# Patient Record
Sex: Female | Born: 1980
Health system: Southern US, Community
[De-identification: ages and names within clinical notes are randomized; demographics above are authoritative.]

## PROBLEM LIST (undated history)

## (undated) DIAGNOSIS — T782XXA Anaphylactic shock, unspecified, initial encounter: Secondary | ICD-10-CM

## (undated) DIAGNOSIS — K900 Celiac disease: Secondary | ICD-10-CM

## (undated) DIAGNOSIS — D75839 Thrombocytosis, unspecified: Secondary | ICD-10-CM

## (undated) DIAGNOSIS — D509 Iron deficiency anemia, unspecified: Secondary | ICD-10-CM

## (undated) DIAGNOSIS — D473 Essential (hemorrhagic) thrombocythemia: Secondary | ICD-10-CM

## (undated) DIAGNOSIS — O24419 Gestational diabetes mellitus in pregnancy, unspecified control: Secondary | ICD-10-CM

## (undated) DIAGNOSIS — O1490 Unspecified pre-eclampsia, unspecified trimester: Secondary | ICD-10-CM

## (undated) DIAGNOSIS — K253 Acute gastric ulcer without hemorrhage or perforation: Secondary | ICD-10-CM

## (undated) DIAGNOSIS — O9902 Anemia complicating childbirth: Secondary | ICD-10-CM

## (undated) HISTORY — DX: Anaphylactic shock, unspecified, initial encounter: T78.2XXA

## (undated) SURGERY — Surgical Case
Anesthesia: *Unknown

---

## 1998-04-13 HISTORY — PX: APPENDECTOMY: SHX54

## 1999-07-30 ENCOUNTER — Encounter (INDEPENDENT_AMBULATORY_CARE_PROVIDER_SITE_OTHER): Payer: Self-pay | Admitting: Specialist

## 1999-07-30 ENCOUNTER — Encounter (HOSPITAL_BASED_OUTPATIENT_CLINIC_OR_DEPARTMENT_OTHER): Payer: Self-pay | Admitting: General Surgery

## 1999-07-31 ENCOUNTER — Encounter (HOSPITAL_BASED_OUTPATIENT_CLINIC_OR_DEPARTMENT_OTHER): Payer: Self-pay | Admitting: General Surgery

## 1999-07-31 ENCOUNTER — Inpatient Hospital Stay (HOSPITAL_COMMUNITY): Admission: EM | Admit: 1999-07-31 | Discharge: 1999-08-04 | Payer: Self-pay | Admitting: Emergency Medicine

## 1999-08-02 ENCOUNTER — Encounter (HOSPITAL_BASED_OUTPATIENT_CLINIC_OR_DEPARTMENT_OTHER): Payer: Self-pay | Admitting: General Surgery

## 2003-09-03 ENCOUNTER — Other Ambulatory Visit: Admission: RE | Admit: 2003-09-03 | Discharge: 2003-09-03 | Payer: Self-pay | Admitting: Obstetrics and Gynecology

## 2004-06-25 ENCOUNTER — Inpatient Hospital Stay (HOSPITAL_COMMUNITY): Admission: AD | Admit: 2004-06-25 | Discharge: 2004-06-25 | Payer: Self-pay | Admitting: Obstetrics and Gynecology

## 2004-06-25 ENCOUNTER — Inpatient Hospital Stay (HOSPITAL_COMMUNITY): Admission: AD | Admit: 2004-06-25 | Discharge: 2004-06-28 | Payer: Self-pay | Admitting: Obstetrics and Gynecology

## 2004-06-29 ENCOUNTER — Encounter: Admission: RE | Admit: 2004-06-29 | Discharge: 2004-07-29 | Payer: Self-pay | Admitting: Obstetrics and Gynecology

## 2004-07-30 ENCOUNTER — Encounter: Admission: RE | Admit: 2004-07-30 | Discharge: 2004-08-29 | Payer: Self-pay | Admitting: Obstetrics and Gynecology

## 2004-09-29 ENCOUNTER — Encounter: Admission: RE | Admit: 2004-09-29 | Discharge: 2004-10-29 | Payer: Self-pay | Admitting: Obstetrics and Gynecology

## 2004-11-29 ENCOUNTER — Encounter: Admission: RE | Admit: 2004-11-29 | Discharge: 2004-12-29 | Payer: Self-pay | Admitting: Obstetrics and Gynecology

## 2004-12-30 ENCOUNTER — Encounter: Admission: RE | Admit: 2004-12-30 | Discharge: 2005-01-28 | Payer: Self-pay | Admitting: Obstetrics and Gynecology

## 2005-01-29 ENCOUNTER — Encounter: Admission: RE | Admit: 2005-01-29 | Discharge: 2005-02-28 | Payer: Self-pay | Admitting: Obstetrics and Gynecology

## 2005-03-01 ENCOUNTER — Encounter: Admission: RE | Admit: 2005-03-01 | Discharge: 2005-03-30 | Payer: Self-pay | Admitting: Obstetrics and Gynecology

## 2005-03-31 ENCOUNTER — Encounter: Admission: RE | Admit: 2005-03-31 | Discharge: 2005-04-20 | Payer: Self-pay | Admitting: Obstetrics and Gynecology

## 2005-10-21 ENCOUNTER — Ambulatory Visit: Payer: Self-pay | Admitting: Internal Medicine

## 2007-03-29 ENCOUNTER — Ambulatory Visit: Payer: Self-pay | Admitting: Internal Medicine

## 2007-04-14 DIAGNOSIS — O1490 Unspecified pre-eclampsia, unspecified trimester: Secondary | ICD-10-CM

## 2007-04-14 DIAGNOSIS — O24419 Gestational diabetes mellitus in pregnancy, unspecified control: Secondary | ICD-10-CM

## 2007-04-14 DIAGNOSIS — O9902 Anemia complicating childbirth: Secondary | ICD-10-CM

## 2007-04-14 HISTORY — DX: Anemia complicating childbirth: O99.02

## 2007-04-14 HISTORY — PX: TUBAL LIGATION: SHX77

## 2007-04-14 HISTORY — DX: Unspecified pre-eclampsia, unspecified trimester: O14.90

## 2007-04-14 HISTORY — DX: Gestational diabetes mellitus in pregnancy, unspecified control: O24.419

## 2007-05-27 ENCOUNTER — Ambulatory Visit (HOSPITAL_COMMUNITY): Admission: RE | Admit: 2007-05-27 | Discharge: 2007-05-27 | Payer: Self-pay | Admitting: Obstetrics and Gynecology

## 2007-06-24 ENCOUNTER — Ambulatory Visit (HOSPITAL_COMMUNITY): Admission: RE | Admit: 2007-06-24 | Discharge: 2007-06-24 | Payer: Self-pay | Admitting: Obstetrics and Gynecology

## 2007-07-08 ENCOUNTER — Ambulatory Visit (HOSPITAL_COMMUNITY): Admission: RE | Admit: 2007-07-08 | Discharge: 2007-07-08 | Payer: Self-pay | Admitting: Obstetrics and Gynecology

## 2007-07-21 ENCOUNTER — Ambulatory Visit (HOSPITAL_COMMUNITY): Admission: RE | Admit: 2007-07-21 | Discharge: 2007-07-21 | Payer: Self-pay | Admitting: Obstetrics and Gynecology

## 2007-08-05 ENCOUNTER — Ambulatory Visit (HOSPITAL_COMMUNITY): Admission: RE | Admit: 2007-08-05 | Discharge: 2007-08-05 | Payer: Self-pay | Admitting: Obstetrics and Gynecology

## 2007-08-22 ENCOUNTER — Ambulatory Visit (HOSPITAL_COMMUNITY): Admission: RE | Admit: 2007-08-22 | Discharge: 2007-08-22 | Payer: Self-pay | Admitting: Obstetrics and Gynecology

## 2007-09-06 ENCOUNTER — Ambulatory Visit (HOSPITAL_COMMUNITY): Admission: RE | Admit: 2007-09-06 | Discharge: 2007-09-06 | Payer: Self-pay | Admitting: Obstetrics and Gynecology

## 2007-09-20 ENCOUNTER — Ambulatory Visit (HOSPITAL_COMMUNITY): Admission: RE | Admit: 2007-09-20 | Discharge: 2007-09-20 | Payer: Self-pay | Admitting: Obstetrics and Gynecology

## 2007-09-27 ENCOUNTER — Encounter: Admission: RE | Admit: 2007-09-27 | Discharge: 2007-09-27 | Payer: Self-pay | Admitting: Obstetrics and Gynecology

## 2007-10-07 ENCOUNTER — Ambulatory Visit (HOSPITAL_COMMUNITY): Admission: RE | Admit: 2007-10-07 | Discharge: 2007-10-07 | Payer: Self-pay | Admitting: Obstetrics and Gynecology

## 2007-10-11 ENCOUNTER — Ambulatory Visit: Payer: Self-pay | Admitting: Obstetrics & Gynecology

## 2007-10-18 ENCOUNTER — Ambulatory Visit: Payer: Self-pay | Admitting: Obstetrics & Gynecology

## 2007-10-21 ENCOUNTER — Inpatient Hospital Stay (HOSPITAL_COMMUNITY): Admission: AD | Admit: 2007-10-21 | Discharge: 2007-10-29 | Payer: Self-pay | Admitting: Obstetrics and Gynecology

## 2007-10-21 ENCOUNTER — Encounter: Payer: Self-pay | Admitting: Obstetrics and Gynecology

## 2007-10-25 ENCOUNTER — Encounter (INDEPENDENT_AMBULATORY_CARE_PROVIDER_SITE_OTHER): Payer: Self-pay | Admitting: Obstetrics and Gynecology

## 2007-10-31 ENCOUNTER — Encounter: Admission: RE | Admit: 2007-10-31 | Discharge: 2007-11-30 | Payer: Self-pay | Admitting: Obstetrics and Gynecology

## 2007-11-14 ENCOUNTER — Ambulatory Visit: Admission: RE | Admit: 2007-11-14 | Discharge: 2007-11-14 | Payer: Self-pay | Admitting: Obstetrics and Gynecology

## 2007-12-01 ENCOUNTER — Encounter: Admission: RE | Admit: 2007-12-01 | Discharge: 2007-12-31 | Payer: Self-pay | Admitting: Obstetrics and Gynecology

## 2008-01-01 ENCOUNTER — Encounter: Admission: RE | Admit: 2008-01-01 | Discharge: 2008-01-16 | Payer: Self-pay | Admitting: Obstetrics and Gynecology

## 2008-02-23 ENCOUNTER — Emergency Department (HOSPITAL_COMMUNITY): Admission: EM | Admit: 2008-02-23 | Discharge: 2008-02-23 | Payer: Self-pay | Admitting: Emergency Medicine

## 2009-07-23 ENCOUNTER — Encounter: Admission: RE | Admit: 2009-07-23 | Discharge: 2009-07-23 | Payer: Self-pay | Admitting: Obstetrics and Gynecology

## 2009-08-27 IMAGING — CR DG CHEST 1V PORT
1 series · 1 of 1 positions shown · non-contrast
Comparison: None available

CLINICAL DATA: 33 weeks pregnant, with lesions.  Short of breath.
Evaluate for edema.

PORTABLE CHEST - 1 VIEW

[view not recorded]
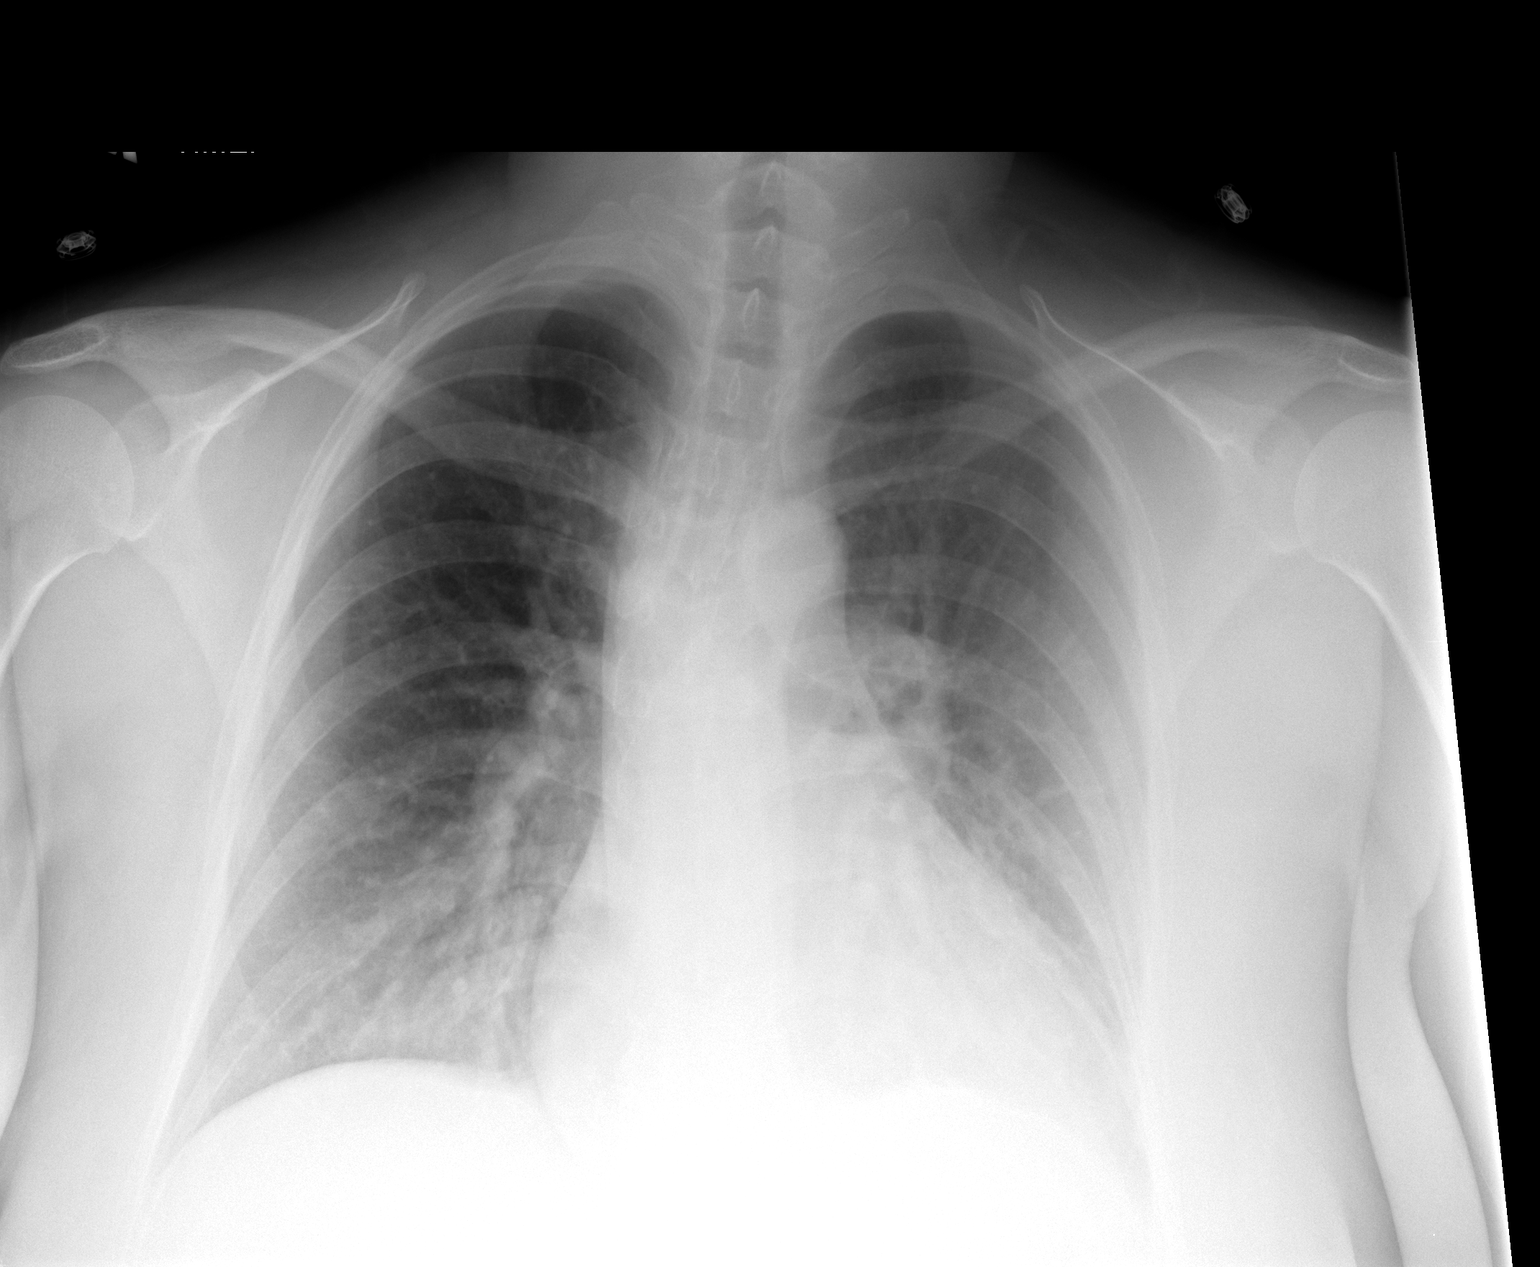

[1 of 1 positions shown; findings below may reference images not displayed]

FINDINGS: The lungs demonstrate bibasilar airspace disease
consistent with pulmonary edema.  This is symmetric.  The
cardiopericardial silhouette is mildly large, for this projection.
Trachea is midline.  No upper lobe airspace disease is present.  No
definite pleural effusion. Due to the patient's pregnancy, she was
double shielded.
IMPRESSION: 1.  Bilateral basilar airspace disease is most consistent with
pulmonary edema.

## 2010-05-05 ENCOUNTER — Encounter: Payer: Self-pay | Admitting: Obstetrics and Gynecology

## 2010-05-29 ENCOUNTER — Inpatient Hospital Stay (INDEPENDENT_AMBULATORY_CARE_PROVIDER_SITE_OTHER)
Admission: RE | Admit: 2010-05-29 | Discharge: 2010-05-29 | Disposition: A | Discharge status: Home / Self Care | Payer: 59 | Source: Ambulatory Visit | Attending: Family Medicine | Admitting: Family Medicine

## 2010-05-29 DIAGNOSIS — J029 Acute pharyngitis, unspecified: Secondary | ICD-10-CM

## 2010-08-26 NOTE — Discharge Summary (Signed)
Deborah Salinas, KERCE                ACCOUNT NO.:  [id number]   MEDICAL RECORD NO.:  [id number]          PATIENT TYPE:  INP   LOCATION:  9311                          FACILITY:  [facility]   PHYSICIAN:  Servando Salina, M.D.DATE OF BIRTH:  Apr 03, 1981   DATE OF ADMISSION:  10/21/2007  DATE OF DISCHARGE:  10/29/2007                               DISCHARGE SUMMARY   ADMISSION DIAGNOSES:  1. Twin gestation at 33-5/7 weeks.  2. Preeclampsia.  3. Class A1 gestational diabetes.  4. Desired sterilization.  5. Iron deficiency anemia   DISCHARGE DIAGNOSES:  1. Twin gestation at 34-2/7 weeks delivered.  2. Worsening of preeclampsia.  3. Pulmonary edema.  4. Class A1 gestational diabetes.  5. Desired sterilization.  6. Iron deficiency anemia   PROCEDURE:  Primary cesarean section and modified Pomeroy tubal  ligation.   HISTORY OF PRESENT ILLNESS:  A 30 year old gravida 42, para 0-1-1-1  female with known twin gestation at 33-5/7 weeks, admitted for  management of diagnosis of preeclampsia.  The patient's prenatal course  had been complicated by class A gestational diabetes.   HOSPITAL COURSE:  The patient was admitted to the antepartum service.  She was placed on continuous monitoring.  PIH labs were repeated.  They  were normal except for low hemoglobin and uric acid of 6.2.  The patient  was placed on magnesium sulfate, was given betamethasone for lung  maturity, and 24-urine was restarted.  Her admission 24-urine protein  had 1002 mg.  The patient had serial blood sugars monitoring during her  stay.  She had a reactive nonstress test x2.  Her PIH labs remained  normal except for increasing uric acid to 7.  The patient, during her  hospital stay, had complained of headache.  Lungs were clear to  auscultation.  She was noted to have some difficulty laying flat.  Repeat 24-urine showed 1199 g of protein.  Lasix was given with some  improvement in her symptoms.  The patient was put on fluid  restrictions.  Chest x-ray showed pulmonary edema.  Given her underlying worsening of  her condition, MSM consultation was obtained, concurred with the  decision to deliver the patient.  The patient underwent a primary  cesarean section and modified Pomeroy tubal ligation on October 25, 2007.  She had live female x2, Apgars of 8 and 9 x2.  See the dictated  operative report for the details.  Postoperatively, the patient was  continued on magnesium sulfate.  Babies went in NICU for prematurity.  The patient was continued on her magnesium and she was diuresing well.  Once that was obtained, the patient was placed on the postpartum floor.  Magnesium discontinued at that point.  Blood pressures did not require  any blood pressure medications and the patient had no evidence of an  infection.  On postop day #4, she was felt well for discharge.  The CBC  on postop day #1 showed hemoglobin of 9.9, platelet count of 271,000,  white count of 15.8, and hemoglobin of 29.3.  The patient was given 2  units of packed red cells due  to a hemoglobin of 7.9 symptomatic.  She  subsequently did well and was deemed ready for discharge.   DISPOSITION:  Home.   CONDITION:  Stable.   DISCHARGE MEDICATIONS:  1. Tylox 1-2 tablets every 3-4 hours p.r.n. pain.  2. Prenatal vitamins one p.o. daily.  3. Niferex Forte 150 mg p.o. b.i.d.   FOLLOWUP APPOINTMENTS:  [facility] in 6 weeks.   DISCHARGE INSTRUCTIONS:  Per the postpartum booklet given.      Servando Salina, M.D.  Electronically Signed     [place]/MEDQ  D:  11/17/2007  T:  11/17/2007  Job:  [id number]

## 2010-08-26 NOTE — Op Note (Signed)
NAMEMOLLEY, HOUSER                ACCOUNT NO.:  [id number]   MEDICAL RECORD NO.:  [id number]         PATIENT TYPE:  WINP   LOCATION:                                FACILITY:  [facility]   PHYSICIAN:  Servando Salina, M.D.DATE OF BIRTH:  26-Aug-1980   DATE OF PROCEDURE:  10/25/2007  DATE OF DISCHARGE:                               OPERATIVE REPORT   PREOPERATIVE DIAGNOSES:  1. Preeclampsia.  2. Pulmonary edema.  3. Twin gestation at 34-2/7 weeks.  4. Desires sterilization.  5. Class A1 gestational diabetes.   POSTOPERATIVE DIAGNOSES:  1. Twin gestation at 34-2/7 weeks.  2. Preeclampsia.  3. Pulmonary edema.  4. Desires sterilization.  5. Class A1 gestational diabetes.   PROCEDURE:  1. Primary cesarean section, Kerr hysterotomy.  2. Modified Pomeroy tubal ligation.   ANESTHESIA:  Spinal.   SURGEON:  Servando Salina, MD   ASSISTANT:  Princess Bruins, MD   INDICATIONS:  A 30 year old gravida 3, para 1 female at 34-2/7 weeks  admitted on October 21, 2007, secondary to mild preeclampsia with a twin  gestation at 33-5/7 weeks.  The patient underwent betamethasone  injections x2.  She was initially placed on magnesium sulfate, 24 urine  that resulted in the patient's admission had 1002 mg of protein.  A  repeat 24 urine was done during the hospitalization which revealed 1100  mg of protein.  PIH serial labs were obtained.  The labs were remarkable  for low hemoglobin and the uric acid that elevated to 9.3. During her  hospitalization, the patient started to have contractions and cervical  exam was closed.  The magnesium helped to abate the space of  contractions.  The patient was continued magnesium sulfate.  On October 24, 2007,  the patient was noted to have a 3 liter plus fluid retention.  Lasix was given 20 mg intravenously.  Chest x-ray was obtained and  showed bilateral pulmonary edema.  With the results, and diuresis of the  Lasix, the patient felt better.  She was only  having chest pressure.  When she laid on the left side, her lungs actually had sounded clear  without any evidence of crackles or wheezes.  Her hemoglobin was 7.2.  The patient was noted to be iron deficient and had been on iron  supplementation from at least 3 weeks consistently prior to her  admission.  She was transfused 2 units of packed red cells in the  anticipate that this patient is going to need surgery.  Discussion with  the maternal fetal medicine via the phone in her care resulted in a  decision to proceed with delivery at 34+ weeks given the pulmonary edema  and the underlying condition, resulting which is a preeclampsia.  On the  morning of October 25, 2007, the patient reported that she was not feeling  that well.  She was having a remote difficulty lying down with  breathing.  She denied any chest pain.  Her extremity which had improved  with initial diuresis was back up to 1-2+ edema, deep tendon reflexes  was 3+ and she was having  headache.  Given those constellation of  symptoms, maternal fetal medicine consultation which had been requested  was cancelled verbally and decision made to proceed with a primary  cesarean section.  The patient expressed a desire for permanent  sterilization as well despite the prematurity of children.  Procedures  were explained.  Consent obtained after the surgical risks were  reviewed.  An indwelling Foley catheter was placed and the patient was  transferred to the operating room.   PROCEDURE:  Under adequate spinal anesthesia, the patient is was placed  in the supine position with a left lateral tilt.  An indwelling Foley  catheter was already in place.  Quarter percent Marcaine was injected  along the planned Pfannenstiel skin incision site.  Pfannenstiel skin  incision was then made, carried down to the rectus fascia.  Rectus  fascia was then opened transversely.  The rectus fascia was then bluntly  and sharply dissected off the rectus  muscles in superior and inferior  fashion.  The rectus muscles were split in the midline.  The parietal  peritoneum was entered sharply and extended.  The vesicouterine  peritoneum was opened transversely.  The bladder was then bluntly  dissected off the lower uterine segment displaced inferiorly.  A  curvilinear low transverse uterine incision was then made and extended  with bandage scissors.  Artificial rupture of membranes was performed.  Subsequent delivery of fetus on the maternal right, live female was  accomplished.  Baby was bulb suctioned.  The abdomen cord was clamped,  and cut.  The baby was transferred to the awaiting pediatrician who  assigned Apgars of 8 and 9 at 105 minutes.  The other twin B was in  breech position.  Artificial rupture of membranes performed and  subsequently delivered as a live female from a footling breech position  and was accomplished in usual breech maneuver.  Baby was bulb suctioned.  The abdomen cord was clamped, and cut.  The baby was transferred to the  awaiting pediatrician who assigned Apgars of 8 and 9 at 105 minutes.  The placenta x2 was manually removed.  Uterine cavity was cleaned of  debris.  Uterine incision had no extension and was closed in 2 layers.  The first layer of 0 Monocryl running locked stitch, second layer was  imbricating using 0 Monocryl suture.  Good hemostasis was noted.  Attention was then turned to the fallopian tubes.  The patient again  confirmed the desire for permanent sterilization.  The midportion of  both fallopian tubes were identified.  The underlying the mesosalpinx  was opened with a cautery.  Proximal and distal portion of the tube was  tied with 0 chromic x2 proximally, and distally and the intervening  segment of tube was removed bilaterally.  Normal ovaries were noted  bilaterally.  The abdomen was then copiously irrigated, suctioned.  The  uterus size was a little bit boggy, increased Pitocin was given.   The  abdomen was irrigated, suctioned with good hemostasis noted.  The  parietal peritoneum was closed with 2-0 Vicryl.  The rectus fascia was  closed with 0 Vicryl x2.  The subcutaneous areas irrigated, small  bleeders cauterized.  Interrupted 2-0 plain sutures were placed.  Skin  approximated with Ethicon staples.   SPECIMEN:  Placenta x2, portion of right and left fallopian tube all  sent to pathology.   ESTIMATED BLOOD LOSS:  800 mL.   INTRAOPERATIVE FLUID:  500 mL urine.   URINE OUTPUT:  400  mL.   COUNTS:  Sponge and instrument counts x2 was correct.   COMPLICATION:  None.   Weight of the baby was 4lb 11oz,  and 4lb 4oz respectively.  The patient  tolerated the procedure well, was transferred to recovery room in stable  condition.  The babies were transferred to the Intensive Care Unit due  to prematurity.      Servando Salina, M.D.  Electronically Signed     [place]/MEDQ  D:  10/25/2007  T:  10/26/2007  Job:  [id number]

## 2010-08-29 NOTE — H&P (Signed)
Deborah Salinas, Deborah Salinas                ACCOUNT NO.:  [id number]   MEDICAL RECORD NO.:  [id number]           PATIENT TYPE:   LOCATION:                                 FACILITY:   PHYSICIAN:  Diona Foley, M.D.   DATE OF BIRTH:  12/10/1980   DATE OF ADMISSION:  06/24/2004  DATE OF DISCHARGE:                                HISTORY & PHYSICAL   ADMITTING DIAGNOSIS:  A 32 week intrauterine pregnancy with severe  oligohydramnios.   HISTORY OF PRESENT ILLNESS:  This is a 30 year old, gravida 1, para 0, white  female at [redacted] weeks gestation by first trimester ultrasound, who underwent  ultrasound today for follow up fetal growth, given the history of small for  gestational age earlier in the pregnancy.  The patient reports good  movement.  Denies loss of fluid, vaginal bleeding, or contractions.  Ultrasound today revealed an estimated fetal weight of 3000 gm, which is a  28th percentile for gestational age, but the amniotic fluid index was  significantly decreased with an AFI of 5, which is less than the third  percentile for gestational age.  Umbilical Doppler study was normal.  Prenatal care has been at [facility] with Dr. Servando Salina as the  primary physician.   PAST MEDICAL HISTORY:  Migraines.   PAST SURGICAL HISTORY:  Appendectomy.   OBSTETRIC HISTORY:  Gravida 1, para 0.   GYNECOLOGIC HISTORY:  History of abnormal Pap smear with follow up normal.   FAMILY HISTORY:  Positive for diabetes, hypothyroidism, depression.   SOCIAL HISTORY:  Denies tobacco, alcohol, or drugs.  She is married, and is  an Programmer, applications.   PRENATAL LABORATORIES:  Blood type B positive, antibody screen negative, RPR  nonreactive, rubella immune, hepatitis B surface antigen nonreactive, HIV  reactive.  Gonorrhea and Chlamydia screen negative.  Pap test within normal  limits.  One hour Glucola within normal limits.  Triple test at 16 weeks  within normal limits.  Group B beta strep negative.   PHYSICAL  EXAMINATION:  VITAL SIGNS:  Blood pressure is 120/80, afebrile.  GENERAL:  She is a well-developed, well-nourished, white female in no acute  distress.  HEART:  Regular rate and rhythm.  LUNGS:  Clear to auscultation.  ABDOMEN:  Gravid, soft, nontender.  Fundal height of 37.  PELVIC:  Cervix is fingertip, thick, -2 station, vertex.  EXTREMITIES:  No clubbing, cyanosis, or edema.  Nontender.   Fetal heart tones in the 140s.   ASSESSMENT:  A 30 year old, gravida 1, para 0, white female at [redacted] weeks  gestation by first trimester ultrasound with severe oligohydramnios.   PLAN:  Recommend induction of labor.  If cervix is unfavorable, recommend  cervical ripening with Cervidil, followed by low-dose Pitocin.  Risks of  cesarean section and induction reviewed with the patient in detail.  Recommend continuous fetal monitoring.  The patient voices understanding of  the indications for induction and its associated risks, and agrees to  proceed.  All questions answered.      [facility]/MEDQ  D:  06/24/2004  T:  06/24/2004  Job:  [id number]

## 2010-09-12 LAB — HM MAMMOGRAPHY: HM Mammogram: NORMAL

## 2011-01-08 LAB — COMPREHENSIVE METABOLIC PANEL
ALT: 11
ALT: 34
ALT: 9
AST: 19
AST: 19
AST: 23
AST: 34
AST: 47 — ABNORMAL HIGH
AST: 53 — ABNORMAL HIGH
Albumin: 1.8 — ABNORMAL LOW
Albumin: 2 — ABNORMAL LOW
Albumin: 2.1 — ABNORMAL LOW
Albumin: 2.2 — ABNORMAL LOW
Alkaline Phosphatase: 107
Alkaline Phosphatase: 118 — ABNORMAL HIGH
Alkaline Phosphatase: 151 — ABNORMAL HIGH
Alkaline Phosphatase: 95
BUN: 10
BUN: 10
BUN: 8
BUN: 9
CO2: 20
CO2: 20
CO2: 21
CO2: 22
Calcium: 7.3 — ABNORMAL LOW
Calcium: 7.5 — ABNORMAL LOW
Calcium: 7.7 — ABNORMAL LOW
Chloride: 107
Chloride: 107
Chloride: 109
Creatinine, Ser: 0.67
Creatinine, Ser: 0.67
Creatinine, Ser: 0.81
Creatinine, Ser: 0.88
GFR calc Af Amer: >60
GFR calc Af Amer: >60
GFR calc Af Amer: >60
GFR calc Af Amer: >60
GFR calc Af Amer: >60
GFR calc Af Amer: >60
GFR calc non Af Amer: >60
GFR calc non Af Amer: >60
GFR calc non Af Amer: >60
Glucose, Bld: 94
Potassium: 3.6
Potassium: 3.7
Potassium: 4.1
Sodium: 136
Sodium: 138
Sodium: 138
Sodium: 138
Total Bilirubin: 0.5
Total Bilirubin: 0.6
Total Bilirubin: 0.6
Total Protein: 4.7 — ABNORMAL LOW
Total Protein: 4.8 — ABNORMAL LOW
Total Protein: 4.8 — ABNORMAL LOW
Total Protein: 5 — ABNORMAL LOW
Total Protein: 5.6 — ABNORMAL LOW

## 2011-01-08 LAB — CBC
HCT: 22.1 — ABNORMAL LOW
HCT: 23.8 — ABNORMAL LOW
HCT: 25.1 — ABNORMAL LOW
HCT: 25.7 — ABNORMAL LOW
HCT: 30.8 — ABNORMAL LOW
Hemoglobin: 10.1 — ABNORMAL LOW
MCHC: 32.8
MCHC: 33.2
MCV: 75.6 — ABNORMAL LOW
MCV: 77 — ABNORMAL LOW
MCV: 77 — ABNORMAL LOW
MCV: 78.7
Platelets: 304
Platelets: 315
Platelets: 347
Platelets: 385
Platelets: 391
RBC: 2.87 — ABNORMAL LOW
RBC: 3.02 — ABNORMAL LOW
RBC: 3.7 — ABNORMAL LOW
RBC: 4.02
RDW: 15.2
RDW: 15.6 — ABNORMAL HIGH
RDW: 15.7 — ABNORMAL HIGH
RDW: 15.9 — ABNORMAL HIGH
WBC: 10.1
WBC: 10.5
WBC: 20.9 — ABNORMAL HIGH

## 2011-01-08 LAB — STREP B DNA PROBE

## 2011-01-08 LAB — LACTATE DEHYDROGENASE
LDH: 178
LDH: 223

## 2011-01-08 LAB — GLUCOSE, CAPILLARY
Glucose-Capillary: 103 — ABNORMAL HIGH
Glucose-Capillary: 113 — ABNORMAL HIGH

## 2011-01-08 LAB — CROSSMATCH: Antibody Screen: NEGATIVE

## 2011-01-08 LAB — MAGNESIUM
Magnesium: 4.2 — ABNORMAL HIGH
Magnesium: 5.1 — ABNORMAL HIGH
Magnesium: 5.8 — ABNORMAL HIGH

## 2011-01-08 LAB — URIC ACID
Uric Acid, Serum: 9 — ABNORMAL HIGH
Uric Acid, Serum: 9 — ABNORMAL HIGH
Uric Acid, Serum: 9.1 — ABNORMAL HIGH

## 2011-01-08 LAB — PROTEIN, URINE, 24 HOUR
Collection Interval-UPROT: 24
Protein, 24H Urine: 1199 — ABNORMAL HIGH
Protein, Urine: 47
Urine Total Volume-UPROT: 2550

## 2011-01-08 LAB — CREATININE CLEARANCE, URINE, 24 HOUR
Creatinine Clearance: 158 — ABNORMAL HIGH
Creatinine, Urine: 59.8

## 2011-01-09 LAB — CBC
HCT: 29.3 — ABNORMAL LOW
Hemoglobin: 10.5 — ABNORMAL LOW
MCHC: 33.6
MCV: 82.3
Platelets: 271
Platelets: 329
RDW: 15.9 — ABNORMAL HIGH
RDW: 16.3 — ABNORMAL HIGH

## 2011-01-09 LAB — COMPREHENSIVE METABOLIC PANEL
ALT: 30
Albumin: 1.8 — ABNORMAL LOW
Alkaline Phosphatase: 110
Calcium: 6.5 — ABNORMAL LOW
GFR calc Af Amer: >60
Potassium: 4.2
Sodium: 137
Total Protein: 4.4 — ABNORMAL LOW

## 2012-08-11 ENCOUNTER — Observation Stay (HOSPITAL_COMMUNITY)
Admission: AD | Admit: 2012-08-11 | Discharge: 2012-08-12 | Disposition: A | Discharge status: Home / Self Care | Payer: 59 | Source: Ambulatory Visit | Attending: Internal Medicine | Admitting: Internal Medicine

## 2012-08-11 ENCOUNTER — Encounter (HOSPITAL_COMMUNITY): Payer: Self-pay | Admitting: Anesthesiology

## 2012-08-11 ENCOUNTER — Ambulatory Visit (HOSPITAL_COMMUNITY)
Admission: RE | Admit: 2012-08-11 | Discharge: 2012-08-11 | Disposition: A | Discharge status: Home / Self Care | Payer: 59 | Source: Ambulatory Visit | Attending: Internal Medicine | Admitting: Internal Medicine

## 2012-08-11 ENCOUNTER — Encounter (HOSPITAL_COMMUNITY)
Admission: AD | Disposition: A | Discharge status: Home / Self Care | Payer: Self-pay | Source: Ambulatory Visit | Attending: Internal Medicine

## 2012-08-11 DIAGNOSIS — R61 Generalized hyperhidrosis: Secondary | ICD-10-CM | POA: Insufficient documentation

## 2012-08-11 DIAGNOSIS — R109 Unspecified abdominal pain: Secondary | ICD-10-CM | POA: Insufficient documentation

## 2012-08-11 DIAGNOSIS — R0602 Shortness of breath: Secondary | ICD-10-CM | POA: Insufficient documentation

## 2012-08-11 DIAGNOSIS — R5381 Other malaise: Secondary | ICD-10-CM

## 2012-08-11 DIAGNOSIS — R079 Chest pain, unspecified: Secondary | ICD-10-CM | POA: Insufficient documentation

## 2012-08-11 DIAGNOSIS — R42 Dizziness and giddiness: Secondary | ICD-10-CM | POA: Insufficient documentation

## 2012-08-11 DIAGNOSIS — R531 Weakness: Secondary | ICD-10-CM

## 2012-08-11 DIAGNOSIS — D509 Iron deficiency anemia, unspecified: Secondary | ICD-10-CM | POA: Diagnosis present

## 2012-08-11 DIAGNOSIS — K900 Celiac disease: Secondary | ICD-10-CM | POA: Insufficient documentation

## 2012-08-11 DIAGNOSIS — K253 Acute gastric ulcer without hemorrhage or perforation: Principal | ICD-10-CM

## 2012-08-11 DIAGNOSIS — T782XXA Anaphylactic shock, unspecified, initial encounter: Secondary | ICD-10-CM

## 2012-08-11 DIAGNOSIS — R630 Anorexia: Secondary | ICD-10-CM | POA: Insufficient documentation

## 2012-08-11 DIAGNOSIS — R1012 Left upper quadrant pain: Secondary | ICD-10-CM | POA: Diagnosis present

## 2012-08-11 HISTORY — DX: Essential (hemorrhagic) thrombocythemia: D47.3

## 2012-08-11 HISTORY — PX: ESOPHAGOGASTRODUODENOSCOPY: SHX5428

## 2012-08-11 HISTORY — DX: Celiac disease: K90.0

## 2012-08-11 HISTORY — DX: Acute gastric ulcer without hemorrhage or perforation: K25.3

## 2012-08-11 HISTORY — DX: Anemia complicating childbirth: O99.02

## 2012-08-11 HISTORY — DX: Unspecified pre-eclampsia, unspecified trimester: O14.90

## 2012-08-11 HISTORY — DX: Iron deficiency anemia, unspecified: D50.9

## 2012-08-11 HISTORY — DX: Thrombocytosis, unspecified: D75.839

## 2012-08-11 HISTORY — DX: Gestational diabetes mellitus in pregnancy, unspecified control: O24.419

## 2012-08-11 LAB — COMPREHENSIVE METABOLIC PANEL
ALT: 9 U/L (ref 0–35)
AST: 17 U/L (ref 0–37)
CO2: 25 mEq/L (ref 19–32)
Calcium: 9.7 mg/dL (ref 8.4–10.5)
Chloride: 105 mEq/L (ref 96–112)
Creatinine, Ser: 0.71 mg/dL (ref 0.50–1.10)
GFR calc Af Amer: >90 mL/min (ref >90)
GFR calc non Af Amer: >90 mL/min (ref >90)
Glucose, Bld: 102 mg/dL — ABNORMAL HIGH (ref 70–99)
Sodium: 139 mEq/L (ref 135–145)
Total Bilirubin: 0.3 mg/dL (ref 0.3–1.2)

## 2012-08-11 LAB — CBC
Hemoglobin: 9.5 g/dL — ABNORMAL LOW (ref 12.0–15.0)
MCH: 19.6 pg — ABNORMAL LOW (ref 26.0–34.0)
MCV: 65.2 fL — ABNORMAL LOW (ref 78.0–100.0)
RBC: 4.85 MIL/uL (ref 3.87–5.11)
WBC: 6.2 10*3/uL (ref 4.0–10.5)

## 2012-08-11 LAB — IRON AND TIBC
Iron: 13 ug/dL — ABNORMAL LOW (ref 42–135)
UIBC: 526 ug/dL — ABNORMAL HIGH (ref 125–400)

## 2012-08-11 LAB — T4, FREE: Free T4: 1.38 ng/dL (ref 0.80–1.80)

## 2012-08-11 LAB — RETICULOCYTES
RBC.: 4.36 MIL/uL (ref 3.87–5.11)
Retic Count, Absolute: 43.6 10*3/uL (ref 19.0–186.0)

## 2012-08-11 SURGERY — EGD (ESOPHAGOGASTRODUODENOSCOPY)
Anesthesia: Moderate Sedation

## 2012-08-11 MED ORDER — SODIUM CHLORIDE 0.9 % IV BOLUS (SEPSIS)
1000.0000 mL | Freq: Once | INTRAVENOUS | Status: AC
  Administered 2012-08-11: 1000 mL via INTRAVENOUS

## 2012-08-11 MED ORDER — DIPHENHYDRAMINE HCL 50 MG/ML IJ SOLN
INTRAMUSCULAR | Status: AC
  Administered 2012-08-11: 50 mg via INTRAVENOUS
  Filled 2012-08-11: qty 1

## 2012-08-11 MED ORDER — PANTOPRAZOLE SODIUM 40 MG PO TBEC: 40.0000 mg | DELAYED_RELEASE_TABLET | Freq: Every day | ORAL | Status: DC

## 2012-08-11 MED ORDER — IRON DEXTRAN 50 MG/ML IJ SOLN
25.0000 mg | Freq: Once | INTRAMUSCULAR | Status: AC
  Administered 2012-08-11: 25 mg via INTRAVENOUS
  Filled 2012-08-11: qty 0.5

## 2012-08-11 MED ORDER — ONDANSETRON HCL 4 MG PO TABS: 4.0000 mg | ORAL_TABLET | Freq: Four times a day (QID) | ORAL | Status: DC | PRN

## 2012-08-11 MED ORDER — DEXLANSOPRAZOLE 60 MG PO CPDR: 60.0000 mg | DELAYED_RELEASE_CAPSULE | Freq: Every day | ORAL | Status: DC

## 2012-08-11 MED ORDER — SODIUM CHLORIDE 0.9 % IV SOLN
INTRAVENOUS | Status: DC
  Administered 2012-08-11: 500 mL via INTRAVENOUS

## 2012-08-11 MED ORDER — PANTOPRAZOLE SODIUM 40 MG PO TBEC
40.0000 mg | DELAYED_RELEASE_TABLET | Freq: Every day | ORAL | Status: DC
  Administered 2012-08-12: 40 mg via ORAL
  Filled 2012-08-11: qty 1

## 2012-08-11 MED ORDER — DIPHENHYDRAMINE HCL 50 MG/ML IJ SOLN: 50.0000 mg | Freq: Once | INTRAMUSCULAR | Status: AC

## 2012-08-11 MED ORDER — ACETAMINOPHEN 325 MG PO TABS
650.0000 mg | ORAL_TABLET | Freq: Four times a day (QID) | ORAL | Status: DC | PRN
  Administered 2012-08-11 – 2012-08-12 (×2): 650 mg via ORAL
  Filled 2012-08-11 (×2): qty 2

## 2012-08-11 MED ORDER — FENTANYL CITRATE 0.05 MG/ML IJ SOLN
25.0000 ug | INTRAMUSCULAR | Status: DC | PRN
  Filled 2012-08-11: qty 2

## 2012-08-11 MED ORDER — MIDAZOLAM HCL 10 MG/2ML IJ SOLN
INTRAMUSCULAR | Status: DC | PRN
  Administered 2012-08-11 (×2): 2.5 mg via INTRAVENOUS
  Administered 2012-08-11 (×2): 1 mg via INTRAVENOUS

## 2012-08-11 MED ORDER — BUTAMBEN-TETRACAINE-BENZOCAINE 2-2-14 % EX AERO
INHALATION_SPRAY | CUTANEOUS | Status: DC | PRN
  Administered 2012-08-11: 2 via TOPICAL

## 2012-08-11 MED ORDER — FENTANYL CITRATE 0.05 MG/ML IJ SOLN
INTRAMUSCULAR | Status: DC | PRN
  Administered 2012-08-11 (×2): 25 ug via INTRAVENOUS

## 2012-08-11 MED ORDER — ONDANSETRON HCL 4 MG/2ML IJ SOLN: 4.0000 mg | Freq: Four times a day (QID) | INTRAMUSCULAR | Status: DC | PRN

## 2012-08-11 MED ORDER — SUCRALFATE 1 G PO TABS: 1.0000 g | ORAL_TABLET | Freq: Three times a day (TID) | ORAL | Status: DC | PRN

## 2012-08-11 MED ORDER — SODIUM CHLORIDE 0.9 % IV SOLN
1000.0000 mg | Freq: Once | INTRAVENOUS | Status: DC
  Filled 2012-08-11: qty 20

## 2012-08-11 MED ORDER — SUCRALFATE 1 G PO TABS
1.0000 g | ORAL_TABLET | Freq: Three times a day (TID) | ORAL | Status: DC
  Administered 2012-08-11 – 2012-08-12 (×3): 1 g via ORAL
  Filled 2012-08-11 (×7): qty 1

## 2012-08-11 NOTE — H&P (Signed)
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 She reports that she has been diaphoretic and dizzy. + Weakness, fatigue, and SOB. She is currently in NP school at [employer] and has been under a lot of stress. EKG this am in clinic showed NSR and labs were drawn. Over the last couple of months she has lost quite a bit of weight. Poor appetite. No menorrhagia, melena, BRBPR.   Pertinent labs from clinic Hgb 9.5, Hct 31.6 and MCV 65.2    Review of Systems: [y] = yes, [ ]  = no   General: Weight gain [ ] ; Weight loss [Y ]; Anorexia [ ] ; Fatigue [ ] ; Fever [ ] ; Chills [ ] ; Weakness [Y ]  Cardiac: Chest pain/pressure [ Y]; Resting SOB [ N]; Exertional SOB [ ] ; Orthopnea [ ] ; Pedal Edema [ ] ; Palpitations [ ] ; Syncope Aqua.Slicker ]; Presyncope [ ] ; Paroxysmal nocturnal dyspnea[ ]   Pulmonary: Cough Aqua.Slicker ]; Wheezing[ ] ; Hemoptysis[ ] ; Sputum [ ] ; Snoring [ ]   GI: Vomiting[ ] ; Dysphagia[ ] ; Melena[N ]; Hematochezia [ ] ; Heartburn[ ] ; Abdominal pain [ Y]; Constipation [ ] ; Diarrhea [ ] ; BRBPR [ ]   GU: Hematuria[ N]; Dysuria [ ] ; Nocturia[ ]   Vascular: Pain in legs with walking [ ] ; Pain in feet with lying flat [ ] ; Non-healing sores [ ] ; Stroke [ ] ; TIA [ ] ; Slurred speech [ ] ;  Neuro: Headaches[ ] ; Vertigo[ ] ; Seizures[ ] ; Paresthesias[ ] ;Blurred vision [ ] ; Diplopia [ ] ; Vision changes [ ]   Ortho/Skin: Arthritis [ ] ; Joint pain [ ] ; Muscle pain [ ] ; Joint swelling [ ] ; Back Pain [ ] ; Rash [ ]   Psych: Depression[ ] ; Anxiety[ ]   Heme: Bleeding problems [ ] ; Clotting disorders [ ] ; Anemia [ Y]  Endocrine: Diabetes [ ] ; Thyroid dysfunction[ ]   Home Medications Prior to Admission medications   Medication Sig Start Date End Date Taking? Authorizing Provider  dexlansoprazole (DEXILANT) 60 MG capsule Take 1 capsule (60 mg total) by mouth daily. 08/11/12    Jolaine Artist, MD  sucralfate (CARAFATE) 1 G tablet Take 1 tablet (1 g total) by mouth 3 (three) times daily as needed. 08/11/12   Jolaine Artist, MD    Past Medical History: Past Medical History  Diagnosis Date  . Gestational diabetes mellitus in pregnancy 2009  . Pre-eclampsia 2009    Past Surgical History: Past Surgical History  Procedure Laterality Date  . Cesarean section  2009  . Appendectomy  2000    Family History: Family History  Problem Relation Age of Onset  . Hypertension Mother   . Kidney disease Sister     IGA nephropathy  . Cancer Maternal Grandmother     non-hodgkins lymphoma  . Diabetes Maternal Grandfather   . Stroke Maternal Grandfather     Social History: History   Social History  . Marital Status: Married    Spouse Name: ETSUKO DIEROLF    Number of Children: 3  . Years of Education: N/A   Occupational History  . nurse [employer]   Social History Main Topics  . Smoking status: Never Smoker   . Smokeless tobacco: Not on file  . Alcohol Use: 0.0 oz/week     Comment: minimal social ETOH  . Drug Use: No  . Sexually Active: Yes   Other  Topics Concern  . Not on file   Social History Narrative  . No narrative on file    Allergies:  Allergies not on file  Objective:    Vital Signs:       There were no vitals filed for this visit.  Physical Exam: General: NAD; pale fatigued appearing. diaphoretic HEENT: normal Neck: supple. JVP flat . Carotids 2+ bilat; no bruits. No lymphadenopathy or thryomegaly appreciated. Cor: PMI nondisplaced. Regular rate & rhythm. No rubs, gallops or murmurs. Lungs: clear Abdomen: soft,mildly tender LUQ nondistended. No hepatosplenomegaly. No bruits or masses. Good bowel sounds. Extremities: no cyanosis, clubbing, rash, edema Neuro: alert & orientedx3, cranial nerves grossly intact. moves all 4 extremities w/o difficulty. Affect pleasant    Labs: Basic Metabolic Panel:  Recent Labs Lab  08/11/12 0930  NA 139  K 4.0  CL 105  CO2 25  GLUCOSE 102*  BUN 6  CREATININE 0.71  CALCIUM 9.7    Liver Function Tests:  Recent Labs Lab 08/11/12 0930  AST 17  ALT 9  ALKPHOS 66  BILITOT 0.3  PROT 7.3  ALBUMIN 3.7   No results found for this basename: LIPASE, AMYLASE,  in the last 168 hours No results found for this basename: AMMONIA,  in the last 168 hours  CBC:  Recent Labs Lab 08/11/12 0930  WBC 6.2  HGB 9.5*  HCT 31.6*  MCV 65.2*  PLT 526*    Cardiac Enzymes: No results found for this basename: CKTOTAL, CKMB, CKMBINDEX, TROPONINI,  in the last 168 hours  BNP: BNP (last 3 results) No results found for this basename: PROBNP,  in the last 8760 hours  CBG: No results found for this basename: GLUCAP,  in the last 168 hours  Coagulation Studies: No results found for this basename: LABPROT, INR,  in the last 72 hours  Other results:   Imaging: No results found.      Assessment:   1) Ab pain, rule out Gastric Ulcer 2) Microcytic anemia   Plan/Discussion:    Will admit patient and plan for GI scope this afternoon. NPO at this time. Have started dexilant 40 mg daily and carafate 1gm TID. Continue to follow. Patient reports she needs to be on plane Saturday am for talk in [place] on EP.  Length of Stay:  Rande Brunt 08/11/2012, 11:27 AM  Patient seen and examined with Junie Bame, NP. We discussed all aspects of the encounter. I agree with the assessment and plan as stated above.   I am worried she has PUD. We have asked GI to see and consider EGD. PPI and carafate started today. Will give some NS. Await results of GI evaluation (thanks!)  Quillian Quince Bensimhon,MD 1:46 PM

## 2012-08-11 NOTE — Progress Notes (Signed)
MEDICATION RELATED CONSULT NOTE - INITIAL   Pharmacy Consult for IV Iron Indication: Iron deficiency anemia  Allergies  Allergen Reactions  . Erythromycin Rash  . Sulfa Antibiotics Rash    Patient Measurements: Height: 5' 5"  (165.1 cm) Weight: 136 lb (61.689 kg) IBW/kg (Calculated) : 57   Vital Signs: Temp: 98.2 F (36.8 C) (05/01 1430) Temp src: Oral (05/01 1430) BP: 95/47 mmHg (05/01 1529) Pulse Rate: 79 (05/01 1430) Intake/Output from previous day:   Intake/Output from this shift:    Labs:  Recent Labs  08/11/12 0930  WBC 6.2  HGB 9.5*  HCT 31.6*  PLT 526*  CREATININE 0.71  ALBUMIN 3.7  PROT 7.3  AST 17  ALT 9  ALKPHOS 66  BILITOT 0.3   Estimated Creatinine Clearance: 90 ml/min (by C-G formula based on Cr of 0.71).   Microbiology: No results found for this or any previous visit (from the past 720 hour(s)).  Medical History: Past Medical History  Diagnosis Date  . Gestational diabetes mellitus in pregnancy 2009  . Pre-eclampsia 2009  . Anemia complicating childbirth 0263    transfused after 2009 C section.  . Acute gastric ulcers 08/11/2012    Medications:  Prescriptions prior to admission  Medication Sig Dispense Refill  . dexlansoprazole (DEXILANT) 60 MG capsule Take 1 capsule (60 mg total) by mouth daily.  30 capsule  9  . sucralfate (CARAFATE) 1 G tablet Take 1 tablet (1 g total) by mouth 3 (three) times daily as needed.  90 tablet  1    Assessment: 32 yo F admitted 08/11/2012  with significant abdominal pain found to have ulcer on endoscopy.  Pharmacy consulted to dose IV iron  Goal of Therapy:  Hemoglobin 12 mg/ml  Plan:  Iron Dextran 25 mg IV x 1 test dose, if tolerates plan for 1000 mg IV iron infused over 4 hours.   Follow up CBC tomorrow and periodically after discharge.  Pharmacy will sign off at this time - please call if you have further questions.    Thank you for allowing pharmacy to be a part of this patients care  team.  Rowe Robert Pharm.D., BCPS Clinical Pharmacist 08/11/2012 3:42 PM Pager: [phone] Phone: [phone]

## 2012-08-11 NOTE — Consult Note (Signed)
[facility] Consult: 1:57 PM 08/11/2012   Referring Provider: Dr Haroldine Laws  Primary Care Physician:  sheronette Cousins her ObGyn Primary Gastroenterologist:  none   Reason for Consultation:  Anemia.   HPI: Deborah Salinas is a 32 y.o. female.  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  Came to work today c/o fatigue, diaphoresis, dizziness, dyspnea and abdominal pain. Pain was located beneath/behind left breast. This all began at about 5:30 AM.  Had awoken at 4:30 AM feeling well.  She thought a lot of the sxs, along with weight loss from 160 to 134 # since summer 2013, was from stress. She is tired a lot but denies dyspnea.    Never sees BPR or melena. Takes no NSAIds, no ETOH, no hx GB or feflux problems.  No dysphagia.  No syncope. A sister has celiac disease.  No menorrhagia: last period 2 weeks ago.  They last about 3 days and no excessive bleeding.  The pain was not improved after Prilosec, but did ease after tablet of Carafate.  The med list of Carafate and Dexilant is from today, neither had been started but she did take a friends Prilosec along with Carafate once today. Transfused with 2 units of PRBCs after twins born in 2009  She had labs this AM and these show Hgb of 9.5 and MCV of 65.  She performed an EKG which did not show any ischemia.  By my rectal exam she is FOB negative.    Last CBC was 2009 when Hgb/MCV registered 10.5/81  Past Medical History  Diagnosis Date  . Gestational diabetes mellitus in pregnancy 2009  . Pre-eclampsia 2009  . Anemia complicating childbirth 3536    transfused after 2009 C section.    Past Surgical History  Procedure Laterality Date  . Cesarean section  2009  . Appendectomy  2000  . Tubal ligation  2009    along with C section.     Prior to Admission medications    Medication Sig Start Date End Date Taking? Authorizing Provider  dexlansoprazole (DEXILANT) 60 MG capsule Take 1 capsule (60 mg total) by mouth daily. 08/11/12   Jolaine Artist, MD  sucralfate (CARAFATE) 1 G tablet Take 1 tablet (1 g total) by mouth 3 (three) times daily as needed. 08/11/12   Jolaine Artist, MD    Scheduled Meds:   Infusions: . sodium chloride     PRN Meds: fentaNYL, ondansetron (ZOFRAN) IV, ondansetron   Allergies as of 08/11/2012 - Review Complete 08/11/2012  Allergen Reaction Noted  . Erythromycin Rash 08/11/2012  . Sulfa antibiotics Rash 08/11/2012    Family History  Problem Relation Age of Onset  . Hypertension Mother   . Kidney disease Sister     IGA nephropathy.  Does not have ESRD but is on renal transplant list  . Cancer Maternal Grandmother     non-hodgkins lymphoma  . Diabetes Maternal Grandfather   . Stroke Maternal Grandfather     celiac disease                                 Sister ( not the one with IGA nephropathy)  History   Social History  . Marital Status: Married    Spouse Name: LEISL SPURRIER  Number of Children: 3  . Years of Education: Database administrator completed and masters degree in progress.    Occupational History  . Nurse:  RN [employer] History Main Topics  . Smoking status: Never Smoker   . Smokeless tobacco: Not on file  . Alcohol Use: 0.0 oz/week     Comment: minimal social ETOH  . Drug Use: No  . Sexually Active: Yes     REVIEW OF SYSTEMS: Constitutional:   ENT:  No nose bleeds Pulm:  No cough, dyspnea only began this AM CV:  No LE edema, no palpitations.  Pain is kind of in the left chest GU:  No blood in urine, no frequency, no dysuria GI:  Per HPI.  No dysphagia.  No nausea or vomiting Heme:  Per HPI:  No anemia since 2009.    Transfusions:  2009 Neuro:  No headache or numbness or tingling.  No limb weakness.  Derm:  No rash, sores or itching Endocrine:  No excessive thirst, no polyuria.   Immunization:  Flu shot in 2013.  tDap up to date.  Travel:  Not queried   PHYSICAL EXAM: Vital signs in last 24 hours: Temp:  [98.5 F (36.9 C)] 98.5 F (36.9 C) (05/01 1145) Pulse Rate:  [89] 89 (05/01 1145) BP: (122)/(81) 122/81 mmHg (05/01 1145) SpO2:  [100 %] 100 % (05/01 1145)  General: pleasant, pale, comfortable and well appearing WF. Head:  No facial swelling or asymmetry  Eyes:  No conjunctival pallor Ears:  nont HOH  Nose:  No discharge, no sniffling. Mouth:  Clear and moist oral MM.  Good teeth. Neck:  No mass, no goiter, no bruits Lungs:  Clear.  No cough or dyspnea.  No left chest tenderness.  Heart: RRR.  Not tachy.  No murmer, rub, gallop Abdomen:  Soft, no masses.  ND, NT.  No HSM. Rectal: brown stool is FOB negative.    Musc/Skeltl: no joint swelling or deformity. Extremities:  No pedal edema.  Pedal cap refill is brisk  Neurologic:  Pleasant, no tremor, no gross limb weakness Skin:  No telangectasias or hemangiomas. Tattoos:  none Nodes:  No inguinal or cervical adenopathy.    Psych:  Pleasant, relaxed, excellent historian.   LAB RESULTS:  Recent Labs  08/11/12 0930  WBC 6.2  HGB 9.5*  HCT 31.6*  PLT 526*  MCV    65.2  BMET Lab Results  Component Value Date   NA 139 08/11/2012   NA 137 10/26/2007   NA 136 10/25/2007   K 4.0 08/11/2012   K 4.2 10/26/2007   K 3.7 10/25/2007   CL 105 08/11/2012   CL 106 10/26/2007   CL 107 10/25/2007   CO2 25 08/11/2012   CO2 25 10/26/2007   CO2 22 10/25/2007   GLUCOSE 102* 08/11/2012   GLUCOSE 107* 10/26/2007   GLUCOSE 130* 10/25/2007   BUN 6 08/11/2012   BUN 9 10/26/2007   BUN 11 10/25/2007   CREATININE 0.71 08/11/2012   CREATININE 0.92 10/26/2007   CREATININE 0.88 10/25/2007   CALCIUM 9.7 08/11/2012   CALCIUM 6.5* 10/26/2007   CALCIUM 6.8* 10/25/2007   LFT  Recent Labs  08/11/12 0930  PROT 7.3  ALBUMIN 3.7  AST 17  ALT 9  ALKPHOS 66  BILITOT 0.3   PT/INR No results found for this basename: INR,  PROTIME     RADIOLOGY STUDIES: No results found.  ENDOSCOPIC STUDIES: None ever  IMPRESSION: *  Microcytic anemia, left  upper quadrant pain.  Rule out peptic ulcer disease.  A sister has celiac disease so need to rule out pt having this as well, though it does not normally cause abdominal pain.   *  Hx post c section anemia requiring blood transfusion 2009.   *  26 # weight loss in last 10 months, some explainable by running exercise last summer but some not explained.   PLAN: *  EGD today. After that make decision about starting PPI *  For AM ordered CBC, TSH, anemia profile.    LOS: 0 days   Azucena Freed  08/11/2012, 1:57 PM Pager: [phone]    [facility] Attending  I have also seen and assessed the patient and agree with the above note Sometimes celiac disease does cause abdominal pain but hers not typical. Could have never caught up after anemia 4 yrs ago (not sure if iron repleted).  The risks and benefits as well as alternatives of endoscopic procedure(s) have been discussed and reviewed. All questions answered. The patient agrees to proceed.   Gatha Mayer, MD, [facility] [phone] (pager) 08/11/2012 2:47 PM

## 2012-08-11 NOTE — Progress Notes (Signed)
Utilization review completed.  

## 2012-08-11 NOTE — Progress Notes (Addendum)
Patient developed flushed skin, itching, SOB, HR=140 at the end of Infed test dose infusion.  Dr. Rayann Heman and Leone Haven, PA at bedside when this occurred.  New orders received.  Will Continue to monitor

## 2012-08-11 NOTE — Op Note (Signed)
[facility] [place], [id number]   ENDOSCOPY PROCEDURE REPORT  PATIENT: Deborah, Salinas  MR#: [id number] BIRTHDATE: 18-Aug-1980 , 31  yrs. old GENDER: Female ENDOSCOPIST: Gatha Mayer, MD, [facility] REFERRED BY:  Jolaine Artist, M.D. PROCEDURE DATE:  08/11/2012 PROCEDURE:  EGD w/ biopsy ASA CLASS:     Class II INDICATIONS:  abdominal pain in upper left quadrant.   Iron deficiency anemia. MEDICATIONS: Fentanyl 50 mcg IV and Versed 7 mg IV TOPICAL ANESTHETIC: Cetacaine Spray  DESCRIPTION OF PROCEDURE: After the risks benefits and alternatives of the procedure were thoroughly explained, informed consent was obtained.  The Pentax Gastroscope [id number] endoscope was introduced through the mouth and advanced to the second portion of the duodenum. Without limitations.  The instrument was slowly withdrawn as the mucosa was fully examined.     STOMACH: Multiple medium sized large irregular shaped, shallow and clean-based ulcers were found in the gastric antrum and at the angularis.  Biopsies were taken at edge of the ulcers, at the center of the ulcers and around the ulcers.  DUODENUM: Abnormal mucosa was found in the 2nd part of the duodenum. Notched mucosal folds.  Multiple biopsies were performed using cold forceps.  Sample sent for histology.  The remainder of the upper endoscopy exam was otherwise normal. Retroflexed views revealed no abnormalities.     The scope was then withdrawn from the patient and the procedure completed.  COMPLICATIONS: There were no complications. ENDOSCOPIC IMPRESSION: 1.   Multiple medium sized and large superficial  ulcers were found in the gastric antrum and at the angularis - biopsied - likely cause of pain, suspect anemia chronic 2.   Abnormal mucosa was found in the 2nd part of the duodenum; multiple biopsies of notched mucosa, ? celiac disease 3.   The remainder of the upper endoscopy exam was otherwise  normal  RECOMMENDATIONS: 1.  PPI qam 2.  Await pathology results 3.  Iron supplementation IV x 1 (ordered)  eSigned:  Gatha Mayer, MD, [facility] 08/11/2012 3:31 PM  CC:The Patient

## 2012-08-12 ENCOUNTER — Encounter (HOSPITAL_COMMUNITY): Payer: Self-pay | Admitting: Internal Medicine

## 2012-08-12 ENCOUNTER — Telehealth: Payer: Self-pay | Admitting: Physician Assistant

## 2012-08-12 ENCOUNTER — Encounter: Payer: Self-pay | Admitting: Internal Medicine

## 2012-08-12 DIAGNOSIS — T782XXA Anaphylactic shock, unspecified, initial encounter: Secondary | ICD-10-CM

## 2012-08-12 HISTORY — DX: Anaphylactic shock, unspecified, initial encounter: T78.2XXA

## 2012-08-12 LAB — CBC
HCT: 28.4 % — ABNORMAL LOW (ref 36.0–46.0)
MCHC: 29.6 g/dL — ABNORMAL LOW (ref 30.0–36.0)
Platelets: 405 10*3/uL — ABNORMAL HIGH (ref 150–400)
RDW: 16.8 % — ABNORMAL HIGH (ref 11.5–15.5)
WBC: 7.8 10*3/uL (ref 4.0–10.5)

## 2012-08-12 LAB — FERRITIN: Ferritin: <1 ng/mL — ABNORMAL LOW (ref 10–291)

## 2012-08-12 MED ORDER — ADULT MULTIVITAMIN W/MINERALS CH: 1.0000 | ORAL_TABLET | Freq: Every day | ORAL | Status: DC

## 2012-08-12 MED ORDER — FAMOTIDINE IN NACL 20-0.9 MG/50ML-% IV SOLN
20.0000 mg | Freq: Once | INTRAVENOUS | Status: AC
  Administered 2012-08-12: 20 mg via INTRAVENOUS
  Filled 2012-08-12: qty 50

## 2012-08-12 MED ORDER — DOCUSATE SODIUM 100 MG PO CAPS: 100.0000 mg | ORAL_CAPSULE | Freq: Every day | ORAL | Status: DC

## 2012-08-12 MED ORDER — FERROUS SULFATE 325 (65 FE) MG PO TABS: 325.0000 mg | ORAL_TABLET | Freq: Two times a day (BID) | ORAL | Status: DC

## 2012-08-12 MED ORDER — DIPHENHYDRAMINE HCL 25 MG PO CAPS
25.0000 mg | ORAL_CAPSULE | Freq: Four times a day (QID) | ORAL | Status: DC | PRN
Start: 2012-08-12 — End: 2012-08-30

## 2012-08-12 MED ORDER — EPINEPHRINE HCL 1 MG/ML IJ SOLN
INTRAMUSCULAR | Status: AC
  Filled 2012-08-12: qty 1

## 2012-08-12 MED ORDER — METHYLPREDNISOLONE SODIUM SUCC 125 MG IJ SOLR
60.0000 mg | Freq: Once | INTRAMUSCULAR | Status: AC
  Administered 2012-08-12: 60 mg via INTRAVENOUS

## 2012-08-12 MED ORDER — SODIUM CHLORIDE 0.9 % IV SOLN
25.0000 mg | Freq: Once | INTRAVENOUS | Status: AC
  Administered 2012-08-12: 25 mg via INTRAVENOUS
  Filled 2012-08-12: qty 2

## 2012-08-12 MED ORDER — EPINEPHRINE 0.3 MG/0.3ML IJ SOAJ: 0.3000 mg | INTRAMUSCULAR | Status: DC

## 2012-08-12 MED ORDER — FAMOTIDINE 40 MG PO TABS: 40.0000 mg | ORAL_TABLET | Freq: Every day | ORAL | Status: DC | PRN

## 2012-08-12 MED ORDER — SUCRALFATE 1 G PO TABS: 1.0000 g | ORAL_TABLET | Freq: Four times a day (QID) | ORAL | Status: DC | PRN

## 2012-08-12 MED ORDER — ALBUTEROL SULFATE (5 MG/ML) 0.5% IN NEBU
INHALATION_SOLUTION | RESPIRATORY_TRACT | Status: AC
  Administered 2012-08-12: 2.5 mg
  Filled 2012-08-12: qty 0.5

## 2012-08-12 MED ORDER — SODIUM CHLORIDE 0.9 % IV SOLN
250.0000 mg | Freq: Once | INTRAVENOUS | Status: AC
  Administered 2012-08-12: 250 mg via INTRAVENOUS
  Filled 2012-08-12: qty 20

## 2012-08-12 MED ORDER — ONDANSETRON 4 MG PO TBDP: 4.0000 mg | ORAL_TABLET | Freq: Three times a day (TID) | ORAL | Status: DC | PRN

## 2012-08-12 MED ORDER — DIPHENHYDRAMINE HCL 50 MG/ML IJ SOLN
25.0000 mg | INTRAMUSCULAR | Status: DC | PRN
Start: 2012-08-12 — End: 2012-08-12
  Administered 2012-08-12 (×2): 25 mg via INTRAVENOUS
  Filled 2012-08-12: qty 1

## 2012-08-12 MED ORDER — METHYLPREDNISOLONE SODIUM SUCC 125 MG IJ SOLR
60.0000 mg | INTRAMUSCULAR | Status: AC
  Administered 2012-08-12: 60 mg via INTRAVENOUS
  Filled 2012-08-12: qty 0.96

## 2012-08-12 MED ORDER — PANTOPRAZOLE SODIUM 40 MG PO TBEC: 40.0000 mg | DELAYED_RELEASE_TABLET | Freq: Every day | ORAL | Status: DC

## 2012-08-12 NOTE — Progress Notes (Signed)
Met with Deborah Salinas on behalf of [employer] to [employer] program for [employer] employees with [employer]. She does not have any current Link to Wellness needs. Left contact number if she should need Link to Wellness services in the future.  Marthenia Rolling, MSN- Ed, [facility] [phone]

## 2012-08-12 NOTE — Telephone Encounter (Signed)
Addendum to hospital discharge: given patient's significant reaction to iron infusion this admission and possible need for iron tablet therapy in the future, will rx Epipen. Will also rx Pepcid for her to have on hand in case of another allergic reaction. She has benadryl readily available already at home. Rx sent into patient's pharmacy. Kasson Lamere PA-C

## 2012-08-12 NOTE — Discharge Summary (Signed)
Discharge Summary   Patient ID: CHANDLER STOFER MRN: [id number], DOB/AGE: 11-04-1980 32 y.o. Admit date: 08/11/2012 D/C date:     08/12/2012  Primary Cardiologist: new to [facility], saw Dr. Haroldine Laws this admission, seen by Carlean Purl with GI  Primary Discharge Diagnoses:  1. Gastric ulcers 2. Iron deficiency anemia, question acute on chronic - ferritin <1 - significant allergic reactions to both iron dextran and gluconate 3. Celiac disease by EGD 4. Thrombocytosis, felt reactive  Secondary Discharge Diagnoses:  1. Gestational diabetes mellitus in pregnancy 2. History of pre-eclampsia 3. 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 She reported L chest pain and upper abdominal pain beginning yesterday morning. This was associated with diaphoresis, dizziness, weakness, fatigue and SOB. She is currently in NP school at [employer] and has been under a lot of stress. EKG in clinic showed NSR; labs were drawn showing Hgb of 9.5, MCV 66.5 thus she was admitted for further evaluation. Carafate/PPI were started empirically. EGD yesterday demonstrated: 1. Multiple medium sized and large superficial ulcers were found in the gastric antrum and at the angularis - biopsied - likely cause of pain, suspect anemia chronic  2. Abnormal mucosa was found in the 2nd part of the duodenum; multiple biopsies of notched mucosa, ? celiac disease - pathology was positive for this 3. The remainder of the upper endoscopy exam was otherwise normal Iron studies were significant for severely low ferritin <1, iron 13, TIBC 539, %sat 2, UIBC 526. She was prescribed iron dextran infusion but quickly developed an allergic reaction to this requiring IV benadryl with improvement. She tolerated a test dose of iron gluconate, but after getting  most of the infusion, she began to develop a significant allergic reaction characterized by hand/foot tingling, welts, diaphoresis, presyncope and chest tightness. This was subsequently treated with benadryl, pepcid, IV fluids, nebulizer with significant improvement in symptoms. Vitals remain stable at this time. Although ulcers were found on EGD, it was postulated that her anemia was actually more chronic. She required blood transfusion with C-section in pregnancy several years ago and it is unclear if she had restored her blood counts and iron stores sufficiently since that time. This will need to be followed as an outpatient. Elevated platelets were felt to be reactive. GI has recommended 1 week of carafate, and at least 6 weeks of PPI. They will see her in followup and will call her with her pathology. Hgb is 8.4 at discharge, felt slightly lower due to the IV fluids given to the patient yesterday. Dr. Haroldine Laws has seen and examined her today and feels she is stable for discharge.  The patient has samples of Dexilant at home. Protonix is actually less costly through her insurance so this was prescribed at discharge for her to continue once she has used up the [facility] samples. Per discussion with Dr. Haroldine Laws, will hold off on ferrous sulfate at discharge for now given her significant allergic reaction and use multivitamin instead (she has previously tolerated ferrous fumarate).  Discharge Vitals: Blood pressure 130/84, pulse 140, temperature 98.3 F (36.8 C), temperature source Oral, resp. rate 18, height 5' 5"  (1.651 m), weight 136 lb (61.689 kg), last menstrual period 08/02/2012, SpO2 100.00%.  Labs: Lab Results  Component Value Date   WBC 7.8 08/12/2012   HGB 8.4* 08/12/2012   HCT  28.4* 08/12/2012   MCV 66.5* 08/12/2012   PLT 405* 08/12/2012     Recent Labs Lab 08/11/12 0930  NA 139  K 4.0  CL 105  CO2 25  BUN 6  CREATININE 0.71  CALCIUM 9.7  PROT 7.3  BILITOT 0.3  ALKPHOS 66  ALT 9    AST 17  GLUCOSE 102*    Diagnostic Studies/Procedures   EGD as above  Discharge Medications     Medication List    TAKE these medications       dexlansoprazole 60 MG capsule  Commonly known as:  DEXILANT  Take 1 capsule (60 mg total) by mouth daily.     diphenhydrAMINE 25 mg capsule  Commonly known as:  BENADRYL  Take 1-2 capsules (25-50 mg total) by mouth every 6 (six) hours as needed for allergies (itching or allergic reaction).     docusate sodium 100 MG capsule  Commonly known as:  COLACE  Take 1 capsule (100 mg total) by mouth daily.     multivitamin with minerals Tabs  Take 1 tablet by mouth daily.     ondansetron 4 MG disintegrating tablet  Commonly known as:  ZOFRAN ODT  Take 1 tablet (4 mg total) by mouth every 8 (eight) hours as needed for nausea.     pantoprazole 40 MG tablet  Commonly known as:  PROTONIX  Take 1 tablet (40 mg total) by mouth daily.     sucralfate 1 G tablet  Commonly known as:  CARAFATE  Take 1 tablet (1 g total) by mouth 4 (four) times daily as needed.        Disposition   The patient will be discharged in stable condition to home. Discharge Orders   Future Appointments Provider Department Dept Phone   08/30/2012 9:15 AM Gatha Mayer, MD [facility] [phone]   Future Orders Complete By Expires     Diet - low sodium heart healthy  As directed     Discharge instructions  As directed     Comments:      The carafate is to be taken for 1 week as needed. We anticipate that you will need your PPI for at least 6 weeks - when you are finished with the Dexilant samples, you can change to Protonix (less costly through your insurance). Avoid alcohol and NSAIDS for now. Eat a diet rich in iron. Milk and antacids should not be taken at the same time as iron supplements. They may interfere with the absorption of iron. Vitamin C helps increase the absorption of iron.    Increase activity slowly  As directed      Comments:      No driving for 1 day. You should only resume driving if you feel well.      Follow-up Information   Follow up with Silvano Rusk, MD On 08/30/2012. (9:15 AM to folow up ulcers and anemia. )    Contact information:   [address] [place] [address] [phone]         Duration of Discharge Encounter: Greater than 30 minutes including physician and PA time.  Signed, Melina Copa PA-C 08/12/2012, 4:31 PM  Attending: Patient seen and examined multiple times on day of discharge. Agree with above. Start PPI and iron supplementation.  Iley Breeden,MD 5:08 PM

## 2012-08-12 NOTE — Progress Notes (Signed)
Pt discharged to home per MD order. Pt received and reviewed all discharge instructions and medication information including follow-up appointments and prescriptions.   Pt verbalized understanding. Pt alert and oriented at discharge with no complaints of pain. Pt escorted to private vehicle via wheelchair by nurse. Shanda Bumps

## 2012-08-12 NOTE — Progress Notes (Signed)
  Patient: Deborah Salinas / Admit Date: 08/11/2012 / Date of Encounter: 08/12/2012, 6:59 AM   Subjective  Slept well since pain was well controlled. Mild recurrence of pain this morning but prefers not to take any additional pain medication. Carafate helps.    Objective   Telemetry: NSR/SA Physical Exam: Filed Vitals:   08/12/12 0615  BP: 98/62  Pulse: 95  Temp: 98.3  Resp: 16  98% RA General: Well developed, well nourished WF in no acute distress. Head: Normocephalic, atraumatic, sclera non-icteric, no xanthomas, nares are without discharge. OP nonobstructed. Neck: Supple. Lungs: Clear bilaterally to auscultation without wheezes, rales, or rhonchi. No stridor. Breathing is unlabored. Heart: RRR S1 S2 without murmurs, rubs, or gallops.  Abdomen: Soft, non-tender, non-distended with normoactive bowel sounds. No hepatomegaly. No rebound/guarding. No obvious abdominal masses. Msk:  Strength and tone appear normal for age. Extremities: No clubbing or cyanosis. No edema.  Distal pedal pulses are 2+ and equal bilaterally. Neuro: Alert and oriented X 3. Moves all extremities spontaneously. Psych:  Responds to questions appropriately with a normal affect.   No intake or output data in the 24 hours ending 08/12/12 0659  Inpatient Medications:  . pantoprazole  40 mg Oral Daily  . sucralfate  1 g Oral TID WC & HS    Labs:  Recent Labs  08/11/12 0930  NA 139  K 4.0  CL 105  CO2 25  GLUCOSE 102*  BUN 6  CREATININE 0.71  CALCIUM 9.7    Recent Labs  08/11/12 0930  AST 17  ALT 9  ALKPHOS 66  BILITOT 0.3  PROT 7.3  ALBUMIN 3.7    Recent Labs  08/11/12 0930 08/12/12 0600  WBC 6.2 7.8  HGB 9.5* 8.4*  HCT 31.6* 28.4*  MCV 65.2* 66.5*  PLT 526* 405*   Radiology/Studies:  EGD yesterday:ENDOSCOPIC IMPRESSION:  1. Multiple medium sized and large superficial ulcers were found  in the gastric antrum and at the angularis - biopsied - likely  cause of pain, suspect anemia  chronic  2. Abnormal mucosa was found in the 2nd part of the duodenum;  multiple biopsies of notched mucosa, ? celiac disease  3. The remainder of the upper endoscopy exam was otherwise normal    Assessment and Plan  1. Peptic ulcer disease by EGD - continue PPI. See below. 2. Significant iron deficiency anemia - s/p iron transfusion yesterday with allergic reaction which has resolved. Unclear if anemia related to PUD vs more chronic. Hgbs low in 2009 in setting of gestation. Will need to follow as OP. Suspect some drop in Hgb today due to +IVF. Appreciate GI recs. ?daily FeSO4 3. Question of celiac disease by EGD - GI to f/u on path. 4. Thrombocytosis - likely reactive. 5. Borderline BPs - pt states is chronic and typical for her.  Signed, Melina Copa PA-C  Patient seen and examined with Melina Copa, PA-C. We discussed all aspects of the encounter. I agree with the assessment and plan as stated above.   EGD results noted. Pain improved with carafate. Hgb and iron quite low. Agree with Dr. Carlean Purl that a large component of this is probably chronic. Given degree of iron deficiency would like to give her IV iron to help catch up will talk with pharmacy about another formulation to avoid allergic reaction. Can go home later this am with PPI (dexilant), carafate and oral iron supplement with stool softener.  Daniel Bensimhon,MD 7:43 AM

## 2012-08-12 NOTE — Progress Notes (Signed)
Pt's BP 98/62 this morning. Pt asymptomatic and states that "this is normal" for her. [facility] on-call made aware. No new orders given. Will continue to monitor.  Estelle Grumbles, RN 08/12/12

## 2012-08-12 NOTE — Progress Notes (Signed)
[facility] Daily Rounding Note 08/12/2012, 9:05 AM  SUBJECTIVE:       Reaction to test dose of Infed with tachycardia, sweats, shaking.  Dr Haroldine Laws consulted with Pharmacist and ferric gluconate is ordered.  She feels well this AM.  Has had some pain in left chest/UQ, contolled with Carafate.  No nausea.  OBJECTIVE:         Vital signs in last 24 hours:    Temp:  [98.2 F (36.8 C)-98.5 F (36.9 C)] 98.3 F (36.8 C) (05/02 0549) Pulse Rate:  [79-114] 95 (05/02 0549) Resp:  [13-24] 16 (05/02 0549) BP: (94-124)/(40-81) 98/62 mmHg (05/02 0615) SpO2:  [97 %-100 %] 98 % (05/02 0549) Weight:  [61.689 kg (136 lb)] 61.689 kg (136 lb) (05/01 1430) Last BM Date: 08/11/12 General: pale, looks well but tired   Heart: RRR Chest: clear B.  No resp distress Abdomen: soft, NT, active BS  Extremities: no CCE Neuro/Psych:  Pleasant, relaxed.   Intake/Output from previous day:    Intake/Output this shift:    Lab Results:  Recent Labs  08/11/12 0930 08/12/12 0600  WBC 6.2 7.8  HGB 9.5* 8.4*  HCT 31.6* 28.4*  PLT 526* 405*   BMET  Recent Labs  08/11/12 0930  NA 139  K 4.0  CL 105  CO2 25  GLUCOSE 102*  BUN 6  CREATININE 0.71  CALCIUM 9.7   LFT  Recent Labs  08/11/12 0930  PROT 7.3  ALBUMIN 3.7  AST 17  ALT 9  ALKPHOS 66  BILITOT 0.3     Ref. Range 08/11/2012 13:25  Iron Latest Range: 42-135 ug/dL 13 (L)  UIBC Latest Range: 125-400 ug/dL 526 (H)  TIBC Latest Range: 250-470 ug/dL 539 (H)  Saturation Ratios Latest Range: 20-55 % 2 (L)  Ferritin Latest Range: 10-291 ng/mL <1 (L)    B12   239  ASSESMENT: *  Superficial gastric ulcers as source of abdominal pain. *  Rule out celiac disease, biopsies of abnormal looking D2 are pending.   *  Iron deficiency anemia.  Profound with Ferritin below 1.0.  Doubt the ulcers are the only source of the anemia.  *  Adverse reaction to test dose of Infed, ferric gluconate ordered.    PLAN: *  Once daily PPI, she can  use up her many samples of Dexilant.  However Protonix is the least costly for [employer] employees, so can transition to this in future. *  Carafate, 1 gram of slurry, up to 4 times daily.  Advised to time dosing of this 2 hours apart from other meds.  Should not need this for long, maybe one week should suffice.  No NSAIDs or ETOH and she does not use either. *  Biopsies from EGD should return by late Monday.  She will be in [place] then but can be reached by cell #[phone].  She would like to be called with path results.  8  Has ROV with Dr Carlean Purl on May 20th *  Regular diet, eat lots of red meat if she likes it.     LOS: 1 day   Azucena Freed  08/12/2012, 9:05 AM Pager: [phone]  Agree with Ms. Sandi Carne assessment and plan. Gatha Mayer, MD, [facility]   The patient has had an anaphylactoid reaction to the other iron preparation. Gastric biopsies show reactive changes. Duodenal biopsies are consistent with celiac dz - will check TTG Ab's and IgA level for correlation.   Glendell Docker  Simonne Maffucci, MD, [facility] [phone] (pager) 08/12/2012 3:30 PM

## 2012-08-12 NOTE — Progress Notes (Addendum)
CTSP due to sensation of feet tingling/thrombing, hand tingling, and extremity warmth. 12m IV benadryl initially given then an additional 273mdirectly after due to development of skin-colored welts on UE. Patient became diaphoretic, clammy, c/o chest tightness and became very pale. 60104mV solumedrol given stat and repeated x1 due to progressive allergic reaction. 4m45m pepcid ordered and symptoms improved significantly. Shortly after, the patient sat on edge of bed and stood up and became markedly dizzy, pale, clammy, and c/o feeling bad. IV fluids turned wide open and patient moved back to bed. BP 90/56 manually, HR 100, O2 sat 100%. Dr. BensHaroldine Lawsled back to bedside. Neb administered x 1, saline bolus given at 500cc with improvement in symptoms. No acute wheezing, tongue/lip swelling. Current HR 82, BP 108/73, RR 16, Pox 100% RA and patient feeling back to baseline. Will continue to monitor. Deborah Salinas    Attending: Agree. Patient with anaphylactic reaction to IV iron. Treated as above with complete resolution of symptoms.   Deborah Sparger,MD 5:07 PM

## 2012-08-13 LAB — IGA: IgA: 252 mg/dL (ref 69–380)

## 2012-08-14 NOTE — Progress Notes (Signed)
Quick Note:  Patient aware of path results ______

## 2012-08-23 ENCOUNTER — Telehealth: Payer: Self-pay | Admitting: Physician Assistant

## 2012-08-23 NOTE — Telephone Encounter (Signed)
Pt calling for update on results of GI biopsy to clarify if it commented on h pylori, and to see if folate was drawn last admission. Pt informed of results. Has f/u with GI next week. Bud Kaeser PA-C

## 2012-08-30 ENCOUNTER — Encounter: Payer: Self-pay | Admitting: Internal Medicine

## 2012-08-30 ENCOUNTER — Ambulatory Visit (INDEPENDENT_AMBULATORY_CARE_PROVIDER_SITE_OTHER): Payer: 59 | Admitting: Internal Medicine

## 2012-08-30 ENCOUNTER — Other Ambulatory Visit (INDEPENDENT_AMBULATORY_CARE_PROVIDER_SITE_OTHER): Payer: 59

## 2012-08-30 VITALS — BP 110/70 | HR 66 | Ht 66.25 in | Wt 139.0 lb

## 2012-08-30 DIAGNOSIS — R1012 Left upper quadrant pain: Secondary | ICD-10-CM

## 2012-08-30 DIAGNOSIS — K259 Gastric ulcer, unspecified as acute or chronic, without hemorrhage or perforation: Secondary | ICD-10-CM

## 2012-08-30 DIAGNOSIS — D509 Iron deficiency anemia, unspecified: Secondary | ICD-10-CM | POA: Insufficient documentation

## 2012-08-30 DIAGNOSIS — K900 Celiac disease: Secondary | ICD-10-CM | POA: Insufficient documentation

## 2012-08-30 LAB — FERRITIN: Ferritin: 17.7 ng/mL (ref 10.0–291.0)

## 2012-08-30 LAB — CBC WITH DIFFERENTIAL/PLATELET
Basophils Absolute: 0 10*3/uL (ref 0.0–0.1)
Basophils Relative: 0.5 % (ref 0.0–3.0)
Hemoglobin: 11.1 g/dL — ABNORMAL LOW (ref 12.0–15.0)
Lymphocytes Relative: 22.5 % (ref 12.0–46.0)
Monocytes Relative: 5.9 % (ref 3.0–12.0)
Neutro Abs: 4.8 10*3/uL (ref 1.4–7.7)
Neutrophils Relative %: 70.8 % (ref 43.0–77.0)
RBC: 5.08 Mil/uL (ref 3.87–5.11)
RDW: 25.7 % — ABNORMAL HIGH (ref 11.5–14.6)

## 2012-08-30 LAB — AMYLASE: Amylase: 40 U/L (ref 27–131)

## 2012-08-30 MED ORDER — DICYCLOMINE HCL 20 MG PO TABS: 20.0000 mg | ORAL_TABLET | Freq: Four times a day (QID) | ORAL | Status: DC

## 2012-08-30 NOTE — Patient Instructions (Addendum)
Your physician has requested that you go to the basement for lab work before leaving today.  We will be in touch with plans and results.  We have sent the following medications to your pharmacy for you to pick up at your convenience: Generic Bentyl  If not better in 2 weeks please call us back.  Thank you for choosing me and [facility].  Gatha Mayer, M.D., [facility]

## 2012-08-30 NOTE — Progress Notes (Signed)
  Subjective:    Patient ID: Deborah Salinas, female    DOB: [birth year], 32 y.o.   MRN: [id number]  HPI Tanaya is here for f/u after hospitalization for LUQ pain and severe iron-deficiency anemia. She was found to have superficial gastric ulcers and duodenal bxs consistent w/ celiac disease. She received iron infusion but had an allergic reaction. She has been on pantoprazole and intermittent carafate, but still has a gnawing LUQ pain 3 times a week, and it may prevent her from doing things. Food probably makes it worse but no clear triggers. She held her iron but still had the pain.  Wt Readings from Last 3 Encounters:  08/30/12 139 lb (63.05 kg)  08/11/12 136 lb (61.689 kg)  08/11/12 136 lb (61.689 kg)   She is ourt of school now an says stress level is less. LMP is now and no menstrual irregularities or problems.  She has started a gluten free diet and has talked to a dietitian colleague who will send her info. Says that is going ok.  Medications, allergies, past medical history, past surgical history, family history and social history are reviewed and updated in the EMR.  Review of Systems As above    Objective:   Physical Exam General:  NAD Eyes:   anicteric Abdomen:  soft and nontender, BS+  Data Reviewed: 08/11/2012 EGD bxs 1. Duodenum, NOS biopsy - BENIGN DUODENAL MUCOSA WITH VILLOUS BLUNTING AND MARKEDLY INCREASED INTRAEPITHELIAL LYMPHOCYTOSIS, SEE COMMENT. - NO DYSPLASIA OR MALIGNANCY IDENTIFIED. 2. Stomach, biopsy, Antral ulcers - BENIGN ANTRAL TYPE GASTRIC MUCOSA WITH REACTIVE GASTROPATHIC CHANGES CONSISTENT WITH PERIULCERATIVE CHANGE. - WARTHIN-STARRY STAIN IS NEGATIVE FOR HELICOBACTER PYLORI. - NO INTESTINAL METAPLASIA, DYSPLASIA, OR MALIGNANCY.  TTG Ab is still pending due to lab issue - awaiting a reagent to run the test    Assessment & Plan:  LUQ pain  Gastric ulcers  Celiac disease  Iron deficiency anemia  1. Lipase, amylase. CBC and ferritin. Vit D  level. 2. Consider DEXA  3. Await TTG Ab 4. Try dexilant samples instead of pantop 5. Dicyclomine prn 6. REV 1 month 7. If not better in 2 weeks - need to consider imaging, have not thought colonoscopy needed but could be - she is to call me

## 2012-08-31 ENCOUNTER — Telehealth: Payer: Self-pay | Admitting: Internal Medicine

## 2012-08-31 LAB — VITAMIN D 25 HYDROXY (VIT D DEFICIENCY, FRACTURES): Vit D, 25-Hydroxy: 30 ng/mL (ref 30–89)

## 2012-08-31 NOTE — Telephone Encounter (Signed)
Please just read her the results Ok to fax or send a copy to her also (She is an Therapist, sports and NP student)  Still waiting on the TTG Ab

## 2012-08-31 NOTE — Telephone Encounter (Signed)
Advise what to tell patient please Sir.

## 2012-08-31 NOTE — Telephone Encounter (Signed)
Left message on voice mail to call me back.

## 2012-09-01 LAB — TISSUE TRANSGLUTAMINASE, IGA

## 2012-09-02 NOTE — Progress Notes (Signed)
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

## 2012-09-07 NOTE — Telephone Encounter (Signed)
Mailed results to her.

## 2012-09-09 ENCOUNTER — Ambulatory Visit (AMBULATORY_SURGERY_CENTER): Payer: 59 | Admitting: *Deleted

## 2012-09-09 ENCOUNTER — Encounter: Payer: 59 | Admitting: *Deleted

## 2012-09-09 VITALS — Ht 66.0 in | Wt 139.0 lb

## 2012-09-09 DIAGNOSIS — D509 Iron deficiency anemia, unspecified: Secondary | ICD-10-CM

## 2012-09-09 NOTE — Progress Notes (Signed)
No egg or soy allergy

## 2012-09-12 ENCOUNTER — Ambulatory Visit (AMBULATORY_SURGERY_CENTER): Payer: 59 | Admitting: Internal Medicine

## 2012-09-12 ENCOUNTER — Encounter: Payer: Self-pay | Admitting: Internal Medicine

## 2012-09-12 VITALS — BP 106/52 | HR 86 | Temp 99.3°F | Resp 27 | Ht 66.0 in | Wt 139.0 lb

## 2012-09-12 DIAGNOSIS — R933 Abnormal findings on diagnostic imaging of other parts of digestive tract: Secondary | ICD-10-CM

## 2012-09-12 DIAGNOSIS — D509 Iron deficiency anemia, unspecified: Secondary | ICD-10-CM

## 2012-09-12 DIAGNOSIS — K6389 Other specified diseases of intestine: Secondary | ICD-10-CM

## 2012-09-12 DIAGNOSIS — R1012 Left upper quadrant pain: Secondary | ICD-10-CM

## 2012-09-12 MED ORDER — SODIUM CHLORIDE 0.9 % IV SOLN: 500.0000 mL | INTRAVENOUS | Status: DC

## 2012-09-12 MED ORDER — NA SULFATE-K SULFATE-MG SULF 17.5-3.13-1.6 GM/177ML PO SOLN: 354.0000 | Freq: Once | ORAL | Status: DC

## 2012-09-12 NOTE — Progress Notes (Signed)
Patient did not have preoperative order for IV antibiotic SSI prophylaxis. (G8918)  Patient did not experience any of the following events: a burn prior to discharge; a fall within the facility; wrong site/side/patient/procedure/implant event; or a hospital transfer or hospital admission upon discharge from the facility. (G8907)  

## 2012-09-12 NOTE — Op Note (Signed)
[place]  [address] [place], [address]   COLONOSCOPY PROCEDURE REPORT  PATIENT: Deborah, Salinas  MR#: [id number] BIRTHDATE: [birth year] , 31  yrs. old GENDER: Female ENDOSCOPIST: Gatha Mayer, MD, [facility] PROCEDURE DATE:  09/12/2012 PROCEDURE:   Colonoscopy with biopsy ASA CLASS:   Class II INDICATIONS:Iron Deficiency Anemia. MEDICATIONS: propofol (Diprivan) 33m IV, MAC sedation, administered by CRNA, and These medications were titrated to patient response per physician's verbal order  DESCRIPTION OF PROCEDURE:   After the risks benefits and alternatives of the procedure were thoroughly explained, informed consent was obtained.  A digital rectal exam revealed no abnormalities of the rectum.   The LB [id number] endoscope was introduced through the anus and advanced to the cecum, which was identified by both the appendix and ileocecal valve. No adverse events experienced.   The quality of the prep was excellent using Suprep  The instrument was then slowly withdrawn as the colon was fully examined.      COLON FINDINGS: Abnormal mucosa was found throughout the entire examined colon and in the rectum.  The mucosa had possible superficial aphthous-like  ulcers.  Multiple biopsies were performed using cold forceps.   The colon mucosa was otherwise normal.   A right colon retroflexion was performed.  Retroflexed views revealed no abnormalities. The time to cecum=3 minutes 02 seconds.  Withdrawal time=11 minutes 04 seconds.  The scope was withdrawn and the procedure completed. COMPLICATIONS: There were no complications.  ENDOSCOPIC IMPRESSION: 1.   Abnormal mucosa was found throughout the entire examined colon and in the rectum; multiple biopsies were performed using cold forceps - ? very superficial aphthous ulcers vs. prep effecrt changes 2.   The colon mucosa was otherwise normal  RECOMMENDATIONS: 1.  Office will call with the results. I  think the changes are most likely prep effect. 2.   CT abd/pelvis vs GYN eval or both - will discuss   eSigned:  CGatha Mayer MD, F[facility]06/05/2012 4:46 PM  cc: The Patient and DJolaine Artist MD

## 2012-09-12 NOTE — Patient Instructions (Addendum)
The colonoscopy was ok except for some subtle changes in the mucosa that are probably prep effect. Given your problems I took biopsies.  We may need to do a CT scan vs. GYN evaluation or both.  We will call with biopsy results.  I appreciate the opportunity to care for you. Gatha Mayer, MD, FACG   YOU HAD AN ENDOSCOPIC PROCEDURE TODAY AT [facility]: Refer to the procedure report that was given to you for any specific questions about what was found during the examination.  If the procedure report does not answer your questions, please call your gastroenterologist to clarify.  If you requested that your care partner not be given the details of your procedure findings, then the procedure report has been included in a sealed envelope for you to review at your convenience later.  YOU SHOULD EXPECT: Some feelings of bloating in the abdomen. Passage of more gas than usual.  Walking can help get rid of the air that was put into your GI tract during the procedure and reduce the bloating. If you had a lower endoscopy (such as a colonoscopy or flexible sigmoidoscopy) you may notice spotting of blood in your stool or on the toilet paper. If you underwent a bowel prep for your procedure, then you may not have a normal bowel movement for a few days.  DIET: Your first meal following the procedure should be a light meal and then it is ok to progress to your normal diet.  A half-sandwich or bowl of soup is an example of a good first meal.  Heavy or fried foods are harder to digest and may make you feel nauseous or bloated.  Likewise meals heavy in dairy and vegetables can cause extra gas to form and this can also increase the bloating.  Drink plenty of fluids but you should avoid alcoholic beverages for 24 hours.  ACTIVITY: Your care partner should take you home directly after the procedure.  You should plan to take it easy, moving slowly for the rest of the day.  You can resume normal activity  the day after the procedure however you should NOT DRIVE or use heavy machinery for 24 hours (because of the sedation medicines used during the test).    SYMPTOMS TO REPORT IMMEDIATELY: A gastroenterologist can be reached at any hour.  During normal business hours, 8:30 AM to 5:00 PM Monday through Friday, call [phone].  After hours and on weekends, please call the GI answering service at [phone] who will take a message and have the physician on call contact you.   Following lower endoscopy (colonoscopy or flexible sigmoidoscopy):  Excessive amounts of blood in the stool  Significant tenderness or worsening of abdominal pains  Swelling of the abdomen that is new, acute  Fever of 100F or higher  FOLLOW UP: If any biopsies were taken you will be contacted by phone or by letter within the next 1-3 weeks.  Call your gastroenterologist if you have not heard about the biopsies in 3 weeks.  Our staff will call the home number listed on your records the next business day following your procedure to check on you and address any questions or concerns that you may have at that time regarding the information given to you following your procedure. This is a courtesy call and so if there is no answer at the home number and we have not heard from you through the emergency physician on call, we will assume that  you have returned to your regular daily activities without incident.  SIGNATURES/CONFIDENTIALITY: You and/or your care partner have signed paperwork which will be entered into your electronic medical record.  These signatures attest to the fact that that the information above on your After Visit Summary has been reviewed and is understood.  Full responsibility of the confidentiality of this discharge information lies with you and/or your care-partner.  Sherri to call you with CT date.   Contrast given.

## 2012-09-12 NOTE — Progress Notes (Signed)
Stable to RR 

## 2012-09-12 NOTE — Progress Notes (Signed)
Patient did not have preoperative order for IV antibiotic SSI prophylaxis. [id number])

## 2012-09-12 NOTE — Progress Notes (Signed)
Called to room to assist during endoscopic procedure.  Patient ID and intended procedure confirmed with present staff. Received instructions for my participation in the procedure from the performing physician.  

## 2012-09-13 ENCOUNTER — Telehealth: Payer: Self-pay

## 2012-09-13 DIAGNOSIS — R1012 Left upper quadrant pain: Secondary | ICD-10-CM

## 2012-09-13 NOTE — Telephone Encounter (Signed)
  Follow up Call-  Call back number 09/12/2012  Post procedure Call Back phone  # [phone]  Permission to leave phone message Yes     Patient questions:  Do you have a fever, pain , or abdominal swelling? no Pain Score  0 *  Have you tolerated food without any problems? yes  Have you been able to return to your normal activities? yes  Do you have any questions about your discharge instructions: Diet   no Medications  no Follow up visit  no  Do you have questions or concerns about your Care? no  Actions: * If pain score is 4 or above: No action needed, pain <4.

## 2012-09-13 NOTE — Telephone Encounter (Signed)
I spoke with Luetta Nutting, she will talk with scheduling and set up the CT at her convenience, she works at Cardiology.  She is aware the orders are in.

## 2012-09-13 NOTE — Telephone Encounter (Signed)
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 Garwin Brothers her Hatch will call and make an appointment ------

## 2012-09-19 ENCOUNTER — Ambulatory Visit (INDEPENDENT_AMBULATORY_CARE_PROVIDER_SITE_OTHER)
Admission: RE | Admit: 2012-09-19 | Discharge: 2012-09-19 | Disposition: A | Discharge status: Home / Self Care | Payer: 59 | Source: Ambulatory Visit | Attending: Internal Medicine | Admitting: Internal Medicine

## 2012-09-19 DIAGNOSIS — R1012 Left upper quadrant pain: Secondary | ICD-10-CM

## 2012-09-19 MED ORDER — IOHEXOL 300 MG/ML  SOLN
100.0000 mL | Freq: Once | INTRAMUSCULAR | Status: AC | PRN
  Administered 2012-09-19: 100 mL via INTRAVENOUS

## 2012-09-19 NOTE — Progress Notes (Signed)
Quick Note:  We can let her know this is ok..ibuprofen will follow up with her later in week also ______

## 2012-09-23 ENCOUNTER — Telehealth: Payer: Self-pay | Admitting: Internal Medicine

## 2012-09-23 NOTE — Telephone Encounter (Signed)
Patient notified of benign path results.  She is aware that Dr. Carlean Purl will need to review it and advise.  She is aware she will be contacted by letter or mail with the results once final.

## 2012-09-26 ENCOUNTER — Other Ambulatory Visit: Payer: Self-pay

## 2012-09-26 ENCOUNTER — Encounter: Payer: Self-pay | Admitting: Internal Medicine

## 2012-09-26 MED ORDER — HYOSCYAMINE SULFATE ER 0.375 MG PO TBCR: 1.0000 | EXTENDED_RELEASE_TABLET | Freq: Two times a day (BID) | ORAL | Status: DC

## 2012-09-26 NOTE — Progress Notes (Signed)
Quick Note:  I discussed this and biopsies She is still having pain - mostly deals with it but takes Benadryl to sleep at night Stools looser now Plan  1) Schedule EGD first week of July re: LUQ pain, follow-up gastric ulcers and possible celiac dz 2) DC dicyclomine 3) Rx hyoscyamine 0.375 mg bid #60 no refill 4) cc Dr Garwin Brothers of GYN on path and CT results - she is going to see her  ______

## 2012-10-04 ENCOUNTER — Ambulatory Visit: Payer: 59 | Admitting: Internal Medicine

## 2012-10-11 HISTORY — PX: COLONOSCOPY: SHX174

## 2012-10-15 ENCOUNTER — Telehealth: Payer: Self-pay | Admitting: Internal Medicine

## 2012-10-15 DIAGNOSIS — R1013 Epigastric pain: Secondary | ICD-10-CM

## 2012-10-15 NOTE — Telephone Encounter (Signed)
Pt called on call MD late this afternoon complaining of ongoing, though worsened epigastric pain that is radiating to her back. She explained that she has been evaluated recently by Dr. Carlean Purl with imaging and EGD/colonoscopy. She remains on PPI, sucralfate, and still has abd pain.  No fever, chills. Pain rated 8/10 Pt feels likely best to go to ED, I agreed. Will alert Dr. Carlean Purl

## 2012-10-17 ENCOUNTER — Telehealth: Payer: Self-pay

## 2012-10-17 MED ORDER — LIDOCAINE VISCOUS 2 % MT SOLN: 15.0000 mL | OROMUCOSAL | Status: DC | PRN

## 2012-10-17 NOTE — Addendum Note (Signed)
Addended by: Marlon Pel on: 10/17/2012 10:04 AM   Modules accepted: Orders

## 2012-10-17 NOTE — Telephone Encounter (Signed)
Rescheduled to Tuesday.  She will contact me in the am and give me a fax number to send her paperwork to.

## 2012-10-17 NOTE — Telephone Encounter (Signed)
Please call her today and get an update May need to move up EGD if we can I spoke to Dr. Rayann Heman - she works for him - and we were going to try viscous xylocaine

## 2012-10-17 NOTE — Telephone Encounter (Signed)
Patient is rescheduled to [facility] for 11/03/12 8:30 case rx sent to [place] She is aware of appt date and time.

## 2012-10-17 NOTE — Telephone Encounter (Signed)
Message copied by Marlon Pel on Mon Oct 17, 2012  5:19 PM ------      Message from: Silvano Rusk E      Created: Mon Oct 17, 2012  4:49 PM      Regarding: 9 AM Wed is now open       See if Vita can do 9 AM Wed at [facility] for her repeat EGD            Had that slot cancel today            Thanks ------

## 2012-10-18 NOTE — Telephone Encounter (Signed)
I have faxed her instructions to her at [phone], to sign and fax back.

## 2012-10-19 ENCOUNTER — Ambulatory Visit (AMBULATORY_SURGERY_CENTER): Payer: 59 | Admitting: Internal Medicine

## 2012-10-19 ENCOUNTER — Encounter: Payer: Self-pay | Admitting: Internal Medicine

## 2012-10-19 VITALS — BP 110/76 | HR 76 | Temp 98.6°F | Resp 15 | Ht 66.0 in | Wt 139.0 lb

## 2012-10-19 DIAGNOSIS — D509 Iron deficiency anemia, unspecified: Secondary | ICD-10-CM

## 2012-10-19 DIAGNOSIS — R1012 Left upper quadrant pain: Secondary | ICD-10-CM

## 2012-10-19 MED ORDER — HYDROCODONE-ACETAMINOPHEN 5-500 MG PO TABS: 1.0000 | ORAL_TABLET | Freq: Four times a day (QID) | ORAL | Status: DC | PRN

## 2012-10-19 MED ORDER — SODIUM CHLORIDE 0.9 % IV SOLN: 500.0000 mL | INTRAVENOUS | Status: DC

## 2012-10-19 NOTE — Op Note (Signed)
[place]  [address] [place], [address]   ENDOSCOPY PROCEDURE REPORT  PATIENT: Deborah, Salinas  MR#: [id number] BIRTHDATE: 02/14/1981 , 32  yrs. old GENDER: Female ENDOSCOPIST: Gatha Mayer, MD, [facility] PROCEDURE DATE:  10/19/2012 PROCEDURE:  EGD w/ biopsy ASA CLASS:     Class II INDICATIONS:  Iron deficiency anemia.   abdominal pain in upper left quadrant. MEDICATIONS: Propofol (Diprivan) 280 mg IV, MAC sedation, administered by CRNA, and These medications were titrated to patient response per physician's verbal order TOPICAL ANESTHETIC: Cetacaine Spray  DESCRIPTION OF PROCEDURE: After the risks benefits and alternatives of the procedure were thoroughly explained, informed consent was obtained.  The LB [id number] endoscope was introduced through the mouth and advanced to the second portion of the duodenum. Without limitations.  The instrument was slowly withdrawn as the mucosa was fully examined.        ESOPHAGUS: Eosinophilic esophagitis with mucosal changes that included longitudinal furrows and white spots was suspectedin the entire esophagus.   Biopsies taken proximal, mid and distal.  STOMACH: The mucosa of the stomach appeared normal.  Multiple random biopsies were performed.  DUODENUM: Mucosal changes consistent with celiac disease were seen in the 2nd part of the duodenum characterized by scalloping. Multiple biopsies were performed using cold forceps.  Sample sent for histology.  The remainder of the upper endoscopy exam was otherwise normal. Retroflexed views revealed no abnormalities.     The scope was then withdrawn from the patient and the procedure completed.  COMPLICATIONS: There were no complications. ENDOSCOPIC IMPRESSION: 1.   Eosinophilic esophagitis changes were suspected  in the entire esophagus 2.   The mucosa of the stomach appeared normal; multiple random biopsies 3.   Mucosal changes consistent with celiac  disease were seen in the 2nd part of the duodenum; multiple biopsies 4.   The remainder of the upper endoscopy exam was otherwise normal   RECOMMENDATIONS: Office will call with results and plans.    eSigned:  Gatha Mayer, MD, [facility] 10/19/2012 9:31 AM   IP:[id number] Garwin Brothers, MD and The Patient  PATIENT NAME:  Deborah, Salinas MR#: [id number]

## 2012-10-19 NOTE — Patient Instructions (Addendum)
No ulcers today. Some subtle changes in the esophagus that suggest eosinophilic esophagitis but your symptoms are not typical of that. I biopsied the esophagus, stomach and duodenum and will have results tomorrow.  I hope that tells [place] something more.  I appreciate the opportunity to care for you. Gatha Mayer, MD, FACG   YOU HAD AN ENDOSCOPIC PROCEDURE TODAY AT [facility]: Refer to the procedure report that was given to you for any specific questions about what was found during the examination.  If the procedure report does not answer your questions, please call your gastroenterologist to clarify.  If you requested that your care partner not be given the details of your procedure findings, then the procedure report has been included in a sealed envelope for you to review at your convenience later.  YOU SHOULD EXPECT: Some feelings of bloating in the abdomen. Passage of more gas than usual.  Walking can help get rid of the air that was put into your GI tract during the procedure and reduce the bloating  DIET: Your first meal following the procedure should be a light meal and then it is ok to progress to your normal diet.  A half-sandwich or bowl of soup is an example of a good first meal.  Heavy or fried foods are harder to digest and may make you feel nauseous or bloated.  Likewise meals heavy in dairy and vegetables can cause extra gas to form and this can also increase the bloating.  Drink plenty of fluids but you should avoid alcoholic beverages for 24 hours.  ACTIVITY: Your care partner should take you home directly after the procedure.  You should plan to take it easy, moving slowly for the rest of the day.  You can resume normal activity the day after the procedure however you should NOT DRIVE or use heavy machinery for 24 hours (because of the sedation medicines used during the test).    SYMPTOMS TO REPORT IMMEDIATELY: A gastroenterologist can be reached at any hour.   During normal business hours, 8:30 AM to 5:00 PM Monday through Friday, call [phone].  After hours and on weekends, please call the GI answering service at [phone] who will take a message and have the physician on call contact you.   Following upper endoscopy (EGD)  Vomiting of blood or coffee ground material  New chest pain or pain under the shoulder blades  Painful or persistently difficult swallowing  New shortness of breath  Fever of 100F or higher  Black, tarry-looking stools  FOLLOW UP: If any biopsies were taken you will be contacted by phone or by letter within the next 1-3 weeks.  Call your gastroenterologist if you have not heard about the biopsies in 3 weeks.  Our staff will call the home number listed on your records the next business day following your procedure to check on you and address any questions or concerns that you may have at that time regarding the information given to you following your procedure. This is a courtesy call and so if there is no answer at the home number and we have not heard from you through the emergency physician on call, we will assume that you have returned to your regular daily activities without incident.  SIGNATURES/CONFIDENTIALITY: You and/or your care partner have signed paperwork which will be entered into your electronic medical record.  These signatures attest to the fact that that the information above on your After Visit Summary has  been reviewed and is understood.  Full responsibility of the confidentiality of this discharge information lies with you and/or your care-partner.  Please continue your normal medications

## 2012-10-19 NOTE — Progress Notes (Signed)
Called to room to assist during endoscopic procedure.  Patient ID and intended procedure confirmed with present staff. Received instructions for my participation in the procedure from the performing physician.  

## 2012-10-19 NOTE — Progress Notes (Signed)
Patient did not experience any of the following events: a burn prior to discharge; a fall within the facility; wrong site/side/patient/procedure/implant event; or a hospital transfer or hospital admission upon discharge from the facility. (G8907) Patient did not have preoperative order for IV antibiotic SSI prophylaxis. (G8918)  

## 2012-10-19 NOTE — Progress Notes (Signed)
Pt states her throat hurts "a little bit" at discharge but denies difficulty swallowing.  Told to drink warm fluids

## 2012-10-19 NOTE — Progress Notes (Signed)
Per Dr. Carlean Purl please send the specimens "RUSH".  Done so. Maw

## 2012-10-20 ENCOUNTER — Telehealth: Payer: Self-pay | Admitting: *Deleted

## 2012-10-20 NOTE — Telephone Encounter (Signed)
  Follow up Call-  Call back number 10/19/2012 09/12/2012  Post procedure Call Back phone  # [phone]  Permission to leave phone message Yes Yes     Patient questions:  Do you have a fever, pain , or abdominal swelling? no Pain Score  0 *  Have you tolerated food without any problems? yes  Have you been able to return to your normal activities? yes  Do you have any questions about your discharge instructions: Diet   no Medications  no Follow up visit  no  Do you have questions or concerns about your Care? no  Actions: * If pain score is 4 or above: No action needed, pain <4.

## 2012-10-25 NOTE — Progress Notes (Signed)
Quick Note:  I have called results so no letter needed and no recall planned  Please call her from office and tell her its reasonable to get a full celiac panel and if she is willing I would have her do that ______

## 2012-10-26 ENCOUNTER — Other Ambulatory Visit: Payer: Self-pay

## 2012-10-26 DIAGNOSIS — R109 Unspecified abdominal pain: Secondary | ICD-10-CM

## 2012-11-03 ENCOUNTER — Encounter (HOSPITAL_COMMUNITY): Payer: Self-pay

## 2012-11-03 ENCOUNTER — Ambulatory Visit (HOSPITAL_COMMUNITY): Admit: 2012-11-03 | Payer: 59 | Admitting: Internal Medicine

## 2012-11-03 SURGERY — EGD (ESOPHAGOGASTRODUODENOSCOPY)
Anesthesia: Moderate Sedation

## 2012-11-09 ENCOUNTER — Encounter: Payer: 59 | Admitting: Internal Medicine

## 2013-02-16 ENCOUNTER — Other Ambulatory Visit: Payer: Self-pay

## 2013-04-05 ENCOUNTER — Other Ambulatory Visit (INDEPENDENT_AMBULATORY_CARE_PROVIDER_SITE_OTHER): Payer: 59

## 2013-04-05 ENCOUNTER — Encounter: Payer: Self-pay | Admitting: Internal Medicine

## 2013-04-05 ENCOUNTER — Ambulatory Visit (INDEPENDENT_AMBULATORY_CARE_PROVIDER_SITE_OTHER): Payer: 59 | Admitting: Internal Medicine

## 2013-04-05 VITALS — BP 104/70 | HR 90 | Temp 98.9°F | Ht 66.25 in | Wt 143.8 lb

## 2013-04-05 DIAGNOSIS — Z8632 Personal history of gestational diabetes: Secondary | ICD-10-CM | POA: Insufficient documentation

## 2013-04-05 DIAGNOSIS — O99810 Abnormal glucose complicating pregnancy: Secondary | ICD-10-CM

## 2013-04-05 DIAGNOSIS — O24419 Gestational diabetes mellitus in pregnancy, unspecified control: Secondary | ICD-10-CM

## 2013-04-05 DIAGNOSIS — Z0000 Encounter for general adult medical examination without abnormal findings: Secondary | ICD-10-CM

## 2013-04-05 DIAGNOSIS — K900 Celiac disease: Secondary | ICD-10-CM

## 2013-04-05 DIAGNOSIS — D509 Iron deficiency anemia, unspecified: Secondary | ICD-10-CM

## 2013-04-05 LAB — FERRITIN: Ferritin: 2.9 ng/mL — ABNORMAL LOW (ref 10.0–291.0)

## 2013-04-05 LAB — HEPATIC FUNCTION PANEL
Alkaline Phosphatase: 50 U/L (ref 39–117)
Bilirubin, Direct: 0.1 mg/dL (ref 0.0–0.3)
Total Bilirubin: 0.4 mg/dL (ref 0.3–1.2)
Total Protein: 6.9 g/dL (ref 6.0–8.3)

## 2013-04-05 LAB — CBC WITH DIFFERENTIAL/PLATELET
Basophils Relative: 0.8 % (ref 0.0–3.0)
Eosinophils Absolute: 0 10*3/uL (ref 0.0–0.7)
Eosinophils Relative: 0.4 % (ref 0.0–5.0)
HCT: 35.7 % — ABNORMAL LOW (ref 36.0–46.0)
Lymphs Abs: 2 10*3/uL (ref 0.7–4.0)
MCHC: 32.8 g/dL (ref 30.0–36.0)
MCV: 76.2 fl — ABNORMAL LOW (ref 78.0–100.0)
Monocytes Absolute: 0.6 10*3/uL (ref 0.1–1.0)
Platelets: 375 10*3/uL (ref 150.0–400.0)
WBC: 9 10*3/uL (ref 4.5–10.5)

## 2013-04-05 LAB — LIPID PANEL
Cholesterol: 167 mg/dL (ref 0–200)
LDL Cholesterol: 88 mg/dL (ref 0–99)
Total CHOL/HDL Ratio: 2
VLDL: 9.8 mg/dL (ref 0.0–40.0)

## 2013-04-05 LAB — BASIC METABOLIC PANEL
BUN: 12 mg/dL (ref 6–23)
CO2: 23 mEq/L (ref 19–32)
Chloride: 108 mEq/L (ref 96–112)
Creatinine, Ser: 0.7 mg/dL (ref 0.4–1.2)
Potassium: 3.8 mEq/L (ref 3.5–5.1)

## 2013-04-05 LAB — URINALYSIS, ROUTINE W REFLEX MICROSCOPIC
Bilirubin Urine: NEGATIVE
Hgb urine dipstick: NEGATIVE
Total Protein, Urine: NEGATIVE
Urine Glucose: NEGATIVE
Urobilinogen, UA: 0.2 (ref 0.0–1.0)

## 2013-04-05 NOTE — Patient Instructions (Addendum)
It was good to see you today.  We have reviewed your prior records including labs and tests today  Health Maintenance reviewed - all recommended immunizations and age-appropriate screenings are up-to-date.  Test(s) ordered today. Your results will be released to MyChart (or called to you) after review, usually within 72hours after test completion. If any changes need to be made, you will be notified at that same time.  Medications reviewed and updated, no changes recommended at this time.  Please schedule followup in 12 months for annual exam and labs, call sooner if problems.  Health Maintenance, Female A healthy lifestyle and preventative care can promote health and wellness.  Maintain regular health, dental, and eye exams.  Eat a healthy diet. Foods like vegetables, fruits, whole grains, low-fat dairy products, and lean protein foods contain the nutrients you need without too many calories. Decrease your intake of foods high in solid fats, added sugars, and salt. Get information about a proper diet from your caregiver, if necessary.  Regular physical exercise is one of the most important things you can do for your health. Most adults should get at least 150 minutes of moderate-intensity exercise (any activity that increases your heart rate and causes you to sweat) each week. In addition, most adults need muscle-strengthening exercises on 2 or more days a week.   Maintain a healthy weight. The body mass index (BMI) is a screening tool to identify possible weight problems. It provides an estimate of body fat based on height and weight. Your caregiver can help determine your BMI, and can help you achieve or maintain a healthy weight. For adults 20 years and older:  A BMI below 18.5 is considered underweight.  A BMI of 18.5 to 24.9 is normal.  A BMI of 25 to 29.9 is considered overweight.  A BMI of 30 and above is considered obese.  Maintain normal blood lipids and cholesterol by  exercising and minimizing your intake of saturated fat. Eat a balanced diet with plenty of fruits and vegetables. Blood tests for lipids and cholesterol should begin at age 20 and be repeated every 5 years. If your lipid or cholesterol levels are high, you are over 50, or you are a high risk for heart disease, you may need your cholesterol levels checked more frequently.Ongoing high lipid and cholesterol levels should be treated with medicines if diet and exercise are not effective.  If you smoke, find out from your caregiver how to quit. If you do not use tobacco, do not start.  Lung cancer screening is recommended for adults aged 55 80 years who are at high risk for developing lung cancer because of a history of smoking. Yearly low-dose computed tomography (CT) is recommended for people who have at least a 30-pack-year history of smoking and are a current smoker or have quit within the past 15 years. A pack year of smoking is smoking an average of 1 pack of cigarettes a day for 1 year (for example: 1 pack a day for 30 years or 2 packs a day for 15 years). Yearly screening should continue until the smoker has stopped smoking for at least 15 years. Yearly screening should also be stopped for people who develop a health problem that would prevent them from having lung cancer treatment.  If you are pregnant, do not drink alcohol. If you are breastfeeding, be very cautious about drinking alcohol. If you are not pregnant and choose to drink alcohol, do not exceed 1 drink per day. One drink   is considered to be 12 ounces (355 mL) of beer, 5 ounces (148 mL) of wine, or 1.5 ounces (44 mL) of liquor.  Avoid use of street drugs. Do not share needles with anyone. Ask for help if you need support or instructions about stopping the use of drugs.  High blood pressure causes heart disease and increases the risk of stroke. Blood pressure should be checked at least every 1 to 2 years. Ongoing high blood pressure should be  treated with medicines, if weight loss and exercise are not effective.  If you are 55 to 34 years old, ask your caregiver if you should take aspirin to prevent strokes.  Diabetes screening involves taking a blood sample to check your fasting blood sugar level. This should be done once every 3 years, after age 45, if you are within normal weight and without risk factors for diabetes. Testing should be considered at a younger age or be carried out more frequently if you are overweight and have at least 1 risk factor for diabetes.  Breast cancer screening is essential preventative care for women. You should practice "breast self-awareness." This means understanding the normal appearance and feel of your breasts and may include breast self-examination. Any changes detected, no matter how small, should be reported to a caregiver. Women in their 20s and 30s should have a clinical breast exam (CBE) by a caregiver as part of a regular health exam every 1 to 3 years. After age 40, women should have a CBE every year. Starting at age 40, women should consider having a mammogram (breast X-ray) every year. Women who have a family history of breast cancer should talk to their caregiver about genetic screening. Women at a high risk of breast cancer should talk to their caregiver about having an MRI and a mammogram every year.  Breast cancer gene (BRCA)-related cancer risk assessment is recommended for women who have family members with BRCA-related cancers. BRCA-related cancers include breast, ovarian, tubal, and peritoneal cancers. Having family members with these cancers may be associated with an increased risk for harmful changes (mutations) in the breast cancer genes BRCA1 and BRCA2. Results of the assessment will determine the need for genetic counseling and BRCA1 and BRCA2 testing.  The Pap test is a screening test for cervical cancer. Women should have a Pap test starting at age 21. Between ages 21 and 29, Pap  tests should be repeated every 2 years. Beginning at age 30, you should have a Pap test every 3 years as long as the past 3 Pap tests have been normal. If you had a hysterectomy for a problem that was not cancer or a condition that could lead to cancer, then you no longer need Pap tests. If you are between ages 65 and 70, and you have had normal Pap tests going back 10 years, you no longer need Pap tests. If you have had past treatment for cervical cancer or a condition that could lead to cancer, you need Pap tests and screening for cancer for at least 20 years after your treatment. If Pap tests have been discontinued, risk factors (such as a new sexual partner) need to be reassessed to determine if screening should be resumed. Some women have medical problems that increase the chance of getting cervical cancer. In these cases, your caregiver may recommend more frequent screening and Pap tests.  The human papillomavirus (HPV) test is an additional test that may be used for cervical cancer screening. The HPV test looks for   the virus that can cause the cell changes on the cervix. The cells collected during the Pap test can be tested for HPV. The HPV test could be used to screen women aged 30 years and older, and should be used in women of any age who have unclear Pap test results. After the age of 30, women should have HPV testing at the same frequency as a Pap test.  Colorectal cancer can be detected and often prevented. Most routine colorectal cancer screening begins at the age of 50 and continues through age 75. However, your caregiver may recommend screening at an earlier age if you have risk factors for colon cancer. On a yearly basis, your caregiver may provide home test kits to check for hidden blood in the stool. Use of a small camera at the end of a tube, to directly examine the colon (sigmoidoscopy or colonoscopy), can detect the earliest forms of colorectal cancer. Talk to your caregiver about this at  age 50, when routine screening begins. Direct examination of the colon should be repeated every 5 to 10 years through age 75, unless early forms of pre-cancerous polyps or small growths are found.  Hepatitis C blood testing is recommended for all people born from 1945 through 1965 and any individual with known risks for hepatitis C.  Practice safe sex. Use condoms and avoid high-risk sexual practices to reduce the spread of sexually transmitted infections (STIs). Sexually active women aged 25 and younger should be checked for Chlamydia, which is a common sexually transmitted infection. Older women with new or multiple partners should also be tested for Chlamydia. Testing for other STIs is recommended if you are sexually active and at increased risk.  Osteoporosis is a disease in which the bones lose minerals and strength with aging. This can result in serious bone fractures. The risk of osteoporosis can be identified using a bone density scan. Women ages 65 and over and women at risk for fractures or osteoporosis should discuss screening with their caregivers. Ask your caregiver whether you should be taking a calcium supplement or vitamin D to reduce the rate of osteoporosis.  Menopause can be associated with physical symptoms and risks. Hormone replacement therapy is available to decrease symptoms and risks. You should talk to your caregiver about whether hormone replacement therapy is right for you.  Use sunscreen. Apply sunscreen liberally and repeatedly throughout the day. You should seek shade when your shadow is shorter than you. Protect yourself by wearing long sleeves, pants, a wide-brimmed hat, and sunglasses year round, whenever you are outdoors.  Notify your caregiver of new moles or changes in moles, especially if there is a change in shape or color. Also notify your caregiver if a mole is larger than the size of a pencil eraser.  Stay current with your immunizations. Document Released:  10/13/2010 Document Revised: 07/25/2012 Document Reviewed: 10/13/2010 [employer] Patient Information 2014 [employer].  

## 2013-04-05 NOTE — Assessment & Plan Note (Signed)
Severe at diagnosis 08/2012 (ferritin <1) Related to celiac disease absorption and gastric ulcers as per EGD may 2014 Intolerant of IV iron due to anaphylaxis Inconsistent oral iron use Check CBC with ferritin now, consider resumed oral iron as needed

## 2013-04-05 NOTE — Progress Notes (Signed)
Subjective:    Patient ID: Deborah Salinas, female    DOB: 09/25/1980, 32 y.o.   MRN: [id number]  HPI  New patient here today to establish care  patient is here today for annual physical. Patient feels well and has no complaints.  Also reviewed chronic medical issues  Past Medical History  Diagnosis Date  . Gestational diabetes mellitus in pregnancy 2009  . Pre-eclampsia 2009  . Anemia complicating childbirth 1884    transfused after 2009 C section.  . Acute gastric ulcers 08/11/2012    EGD 08/11/12: multiple gastric ulcers.  . Iron deficiency anemia     Ferritin <1 (08/2012)  . Thrombocytosis     ?Reactive in setting of peptic ulcers 08/2012.  Marland Kitchen Celiac disease     POSSIBLE - bx inconclusive 08/2012  . Anaphylactic reaction 08/12/2012    reaction to IV iron despite premed   Family History  Problem Relation Age of Onset  . Hypertension Mother   . Kidney disease Sister     IGA nephropathy  . Cancer Maternal Grandmother     non-hodgkins lymphoma  . Diabetes Maternal Grandfather   . Stroke Maternal Grandfather   . Colon cancer Neg Hx   . Esophageal cancer Neg Hx   . Rectal cancer Neg Hx   . Stomach cancer Neg Hx    History  Substance Use Topics  . Smoking status: Never Smoker   . Smokeless tobacco: Never Used  . Alcohol Use: 0.0 oz/week     Comment: minimal social ETOH    Review of Systems  Constitutional: Negative for fatigue and unexpected weight change.  Respiratory: Negative for cough, shortness of breath and wheezing.   Cardiovascular: Negative for chest pain, palpitations and leg swelling.  Gastrointestinal: Negative for nausea, abdominal pain and diarrhea.  Neurological: Negative for dizziness, weakness, light-headedness and headaches.  Psychiatric/Behavioral: Negative for dysphoric mood. The patient is not nervous/anxious.   All other systems reviewed and are negative.      Objective:   Physical Exam BP 104/70  Pulse 90  Temp(Src) 98.9 F (37.2 C) (Oral)   Ht 5' 6.25" (1.683 m)  Wt 143 lb 12.8 oz (65.227 kg)  BMI 23.03 kg/m2  SpO2 98% Wt Readings from Last 3 Encounters:  04/05/13 143 lb 12.8 oz (65.227 kg)  10/19/12 139 lb (63.05 kg)  09/12/12 139 lb (63.05 kg)   Constitutional: She appears well-developed and well-nourished. No distress.  HENT: Head: Normocephalic and atraumatic. Ears: B TMs ok, no erythema or effusion; Nose: Nose normal. Mouth/Throat: Oropharynx is clear and moist. No oropharyngeal exudate.  Eyes: Conjunctivae and EOM are normal. Pupils are equal, round, and reactive to light. No scleral icterus.  Neck: Normal range of motion. Neck supple. No JVD present. No thyromegaly present.  Cardiovascular: Normal rate, regular rhythm and normal heart sounds.  No murmur heard. No BLE edema. Pulmonary/Chest: Effort normal and breath sounds normal. No respiratory distress. She has no wheezes.  Abdominal: Soft. Bowel sounds are normal. She exhibits no distension. There is no tenderness. no masses Musculoskeletal: Normal range of motion, no joint effusions. No gross deformities Neurological: She is alert and oriented to person, place, and time. No cranial nerve deficit. Coordination, balance, strength, speech and gait are normal.  Skin: Skin is warm and dry. No rash noted. No erythema.  Psychiatric: She has a normal mood and affect. Her behavior is normal. Judgment and thought content normal.   Lab Results  Component Value Date   WBC 6.7  08/30/2012   HGB 11.1* 08/30/2012   HCT 35.2* 08/30/2012   PLT 531.0* 08/30/2012   GLUCOSE 102* 08/11/2012   ALT 9 08/11/2012   AST 17 08/11/2012   NA 139 08/11/2012   K 4.0 08/11/2012   CL 105 08/11/2012   CREATININE 0.71 08/11/2012   BUN 6 08/11/2012   CO2 25 08/11/2012   TSH 1.010 08/11/2012       Assessment & Plan:   CPX/v70.0 - Patient has been counseled on age-appropriate routine health concerns for screening and prevention. These are reviewed and up-to-date. Immunizations are up-to-date or declined. Labs  ordered and reviewed.  Also See problem list. Medications and labs reviewed today.

## 2013-04-05 NOTE — Assessment & Plan Note (Signed)
Hx same 2009 Check a1c annually No results found for this basename: HGBA1C

## 2013-04-05 NOTE — Assessment & Plan Note (Signed)
Left upper quadrant pain symptoms may 2014 improved with treatment of gastric ulcers and gluten-free diet Symptoms also improved with SSRI (begun on advice of [facility] consultation for treatment of "GI migraines"

## 2013-04-07 ENCOUNTER — Encounter: Payer: Self-pay | Admitting: Internal Medicine

## 2013-04-07 LAB — HEMOGLOBIN A1C: Hgb A1c MFr Bld: 6.4 % (ref 4.6–6.5)

## 2013-08-18 ENCOUNTER — Telehealth: Payer: Self-pay | Admitting: Internal Medicine

## 2013-08-18 MED ORDER — AMOXICILLIN 500 MG PO CAPS: 500.0000 mg | ORAL_CAPSULE | Freq: Three times a day (TID) | ORAL | Status: DC

## 2013-08-18 NOTE — Telephone Encounter (Signed)
amox - erx done

## 2013-11-13 ENCOUNTER — Encounter: Payer: Self-pay | Admitting: Internal Medicine

## 2013-11-14 ENCOUNTER — Other Ambulatory Visit: Payer: Self-pay

## 2013-11-14 MED ORDER — PANTOPRAZOLE SODIUM 40 MG PO TBEC: 40.0000 mg | DELAYED_RELEASE_TABLET | Freq: Every day | ORAL | Status: DC

## 2013-11-16 MED ORDER — FERROUS SULFATE 325 (65 FE) MG PO TABS: 325.0000 mg | ORAL_TABLET | Freq: Two times a day (BID) | ORAL | Status: DC

## 2013-12-25 ENCOUNTER — Telehealth: Payer: Self-pay | Admitting: Internal Medicine

## 2013-12-25 DIAGNOSIS — D509 Iron deficiency anemia, unspecified: Secondary | ICD-10-CM

## 2013-12-25 NOTE — Telephone Encounter (Signed)
Sept 2015 OV rescheduled for Jan 2016 - will check Anemia/iron labs now

## 2013-12-28 ENCOUNTER — Ambulatory Visit: Payer: Self-pay | Admitting: Internal Medicine

## 2013-12-29 ENCOUNTER — Other Ambulatory Visit (INDEPENDENT_AMBULATORY_CARE_PROVIDER_SITE_OTHER): Payer: 59

## 2013-12-29 DIAGNOSIS — D509 Iron deficiency anemia, unspecified: Secondary | ICD-10-CM

## 2013-12-29 LAB — CBC WITH DIFFERENTIAL/PLATELET
BASOS PCT: 0.8 % (ref 0.0–3.0)
Basophils Absolute: 0 10*3/uL (ref 0.0–0.1)
EOS ABS: 0.1 10*3/uL (ref 0.0–0.7)
EOS PCT: 0.9 % (ref 0.0–5.0)
HEMATOCRIT: 38.5 % (ref 36.0–46.0)
HEMOGLOBIN: 12.8 g/dL (ref 12.0–15.0)
Lymphocytes Relative: 28.2 % (ref 12.0–46.0)
Lymphs Abs: 1.6 10*3/uL (ref 0.7–4.0)
MCHC: 33.3 g/dL (ref 30.0–36.0)
MCV: 81.4 fl (ref 78.0–100.0)
Monocytes Absolute: 0.5 10*3/uL (ref 0.1–1.0)
Monocytes Relative: 8.6 % (ref 3.0–12.0)
Neutro Abs: 3.6 10*3/uL (ref 1.4–7.7)
Neutrophils Relative %: 61.5 % (ref 43.0–77.0)
PLATELETS: 338 10*3/uL (ref 150.0–400.0)
RBC: 4.73 Mil/uL (ref 3.87–5.11)
RDW: 15.6 % — AB (ref 11.5–15.5)
WBC: 5.8 10*3/uL (ref 4.0–10.5)

## 2013-12-29 LAB — FERRITIN: Ferritin: 5.3 ng/mL — ABNORMAL LOW (ref 10.0–291.0)

## 2014-02-12 ENCOUNTER — Encounter: Payer: Self-pay | Admitting: Internal Medicine

## 2014-04-16 ENCOUNTER — Encounter: Payer: 59 | Admitting: Internal Medicine

## 2014-05-14 ENCOUNTER — Telehealth: Payer: Self-pay | Admitting: *Deleted

## 2014-05-14 ENCOUNTER — Encounter: Payer: Self-pay | Admitting: Internal Medicine

## 2014-05-14 DIAGNOSIS — Z0000 Encounter for general adult medical examination without abnormal findings: Secondary | ICD-10-CM

## 2014-05-14 DIAGNOSIS — O24419 Gestational diabetes mellitus in pregnancy, unspecified control: Secondary | ICD-10-CM

## 2014-05-14 NOTE — Telephone Encounter (Signed)
Pt having cpx 08/17/14 wanting labs done prior. Entered standard cpx labs...Deborah Salinas

## 2014-05-21 ENCOUNTER — Other Ambulatory Visit (INDEPENDENT_AMBULATORY_CARE_PROVIDER_SITE_OTHER): Payer: 59

## 2014-05-21 DIAGNOSIS — Z0189 Encounter for other specified special examinations: Secondary | ICD-10-CM

## 2014-05-21 DIAGNOSIS — O24419 Gestational diabetes mellitus in pregnancy, unspecified control: Secondary | ICD-10-CM

## 2014-05-21 LAB — CBC WITH DIFFERENTIAL/PLATELET
BASOS ABS: 0.1 10*3/uL (ref 0.0–0.1)
Basophils Relative: 0.9 % (ref 0.0–3.0)
EOS ABS: 0.1 10*3/uL (ref 0.0–0.7)
EOS PCT: 2.2 % (ref 0.0–5.0)
HCT: 38.7 % (ref 36.0–46.0)
HEMOGLOBIN: 12.7 g/dL (ref 12.0–15.0)
Lymphocytes Relative: 33.1 % (ref 12.0–46.0)
Lymphs Abs: 1.8 10*3/uL (ref 0.7–4.0)
MCHC: 32.7 g/dL (ref 30.0–36.0)
MCV: 80.3 fl (ref 78.0–100.0)
Monocytes Absolute: 0.5 10*3/uL (ref 0.1–1.0)
Monocytes Relative: 8.6 % (ref 3.0–12.0)
Neutro Abs: 3 10*3/uL (ref 1.4–7.7)
Neutrophils Relative %: 55.2 % (ref 43.0–77.0)
Platelets: 365 10*3/uL (ref 150.0–400.0)
RBC: 4.83 Mil/uL (ref 3.87–5.11)
RDW: 13.8 % (ref 11.5–15.5)
WBC: 5.4 10*3/uL (ref 4.0–10.5)

## 2014-05-21 LAB — LIPID PANEL
Cholesterol: 175 mg/dL (ref 0–200)
HDL: 75.1 mg/dL (ref >39.00)
LDL Cholesterol: 87 mg/dL (ref 0–99)
NonHDL: 99.9
Total CHOL/HDL Ratio: 2
Triglycerides: 63 mg/dL (ref 0.0–149.0)
VLDL: 12.6 mg/dL (ref 0.0–40.0)

## 2014-05-21 LAB — BASIC METABOLIC PANEL
BUN: 8 mg/dL (ref 6–23)
CALCIUM: 8.9 mg/dL (ref 8.4–10.5)
CO2: 23 meq/L (ref 19–32)
CREATININE: 0.76 mg/dL (ref 0.40–1.20)
Chloride: 108 mEq/L (ref 96–112)
GFR: 91.73 mL/min (ref >60.00)
Glucose, Bld: 104 mg/dL — ABNORMAL HIGH (ref 70–99)
POTASSIUM: 4.1 meq/L (ref 3.5–5.1)
SODIUM: 140 meq/L (ref 135–145)

## 2014-05-21 LAB — URINALYSIS, ROUTINE W REFLEX MICROSCOPIC
BILIRUBIN URINE: NEGATIVE
Ketones, ur: NEGATIVE
Leukocytes, UA: NEGATIVE
Nitrite: NEGATIVE
Specific Gravity, Urine: <=1.005 — AB (ref 1.000–1.030)
Total Protein, Urine: NEGATIVE
UROBILINOGEN UA: 0.2 (ref 0.0–1.0)
Urine Glucose: NEGATIVE
pH: 6.5 (ref 5.0–8.0)

## 2014-05-21 LAB — HEPATIC FUNCTION PANEL
ALBUMIN: 3.7 g/dL (ref 3.5–5.2)
ALK PHOS: 72 U/L (ref 39–117)
ALT: 30 U/L (ref 0–35)
AST: 25 U/L (ref 0–37)
Bilirubin, Direct: 0.1 mg/dL (ref 0.0–0.3)
TOTAL PROTEIN: 6.5 g/dL (ref 6.0–8.3)
Total Bilirubin: 0.2 mg/dL (ref 0.2–1.2)

## 2014-05-21 LAB — TSH: TSH: 1.54 u[IU]/mL (ref 0.35–4.50)

## 2014-05-21 LAB — HEMOGLOBIN A1C: HEMOGLOBIN A1C: 6.1 % (ref 4.6–6.5)

## 2014-08-27 ENCOUNTER — Ambulatory Visit (INDEPENDENT_AMBULATORY_CARE_PROVIDER_SITE_OTHER): Payer: 59 | Admitting: Internal Medicine

## 2014-08-27 ENCOUNTER — Encounter: Payer: Self-pay | Admitting: Internal Medicine

## 2014-08-27 VITALS — BP 108/78 | HR 91 | Temp 97.8°F | Ht 66.25 in | Wt 150.8 lb

## 2014-08-27 DIAGNOSIS — O24410 Gestational diabetes mellitus in pregnancy, diet controlled: Secondary | ICD-10-CM | POA: Diagnosis not present

## 2014-08-27 DIAGNOSIS — K900 Celiac disease: Secondary | ICD-10-CM

## 2014-08-27 DIAGNOSIS — D509 Iron deficiency anemia, unspecified: Secondary | ICD-10-CM

## 2014-08-27 DIAGNOSIS — Z0000 Encounter for general adult medical examination without abnormal findings: Secondary | ICD-10-CM | POA: Diagnosis not present

## 2014-08-27 NOTE — Assessment & Plan Note (Signed)
Left upper quadrant pain symptoms may 2014 initial improved with treatment of gastric ulcers and gluten-free diet Now intermittently following diet recommedations but feels symptoms largely controlled No longer on SSRI (begun fall 2014 on advice of [facility] consultation for treatment of "GI migraines") followup GI prn

## 2014-08-27 NOTE — Assessment & Plan Note (Signed)
Hx same 2009 Check a1c annually The patient is asked to make an attempt to improve diet and exercise patterns to aid in medical management of this problem.  Lab Results  Component Value Date   HGBA1C 6.1 05/21/2014

## 2014-08-27 NOTE — Progress Notes (Signed)
Subjective:    Patient ID: Deborah Salinas, female    DOB: November 10, 1980, 34 y.o.   MRN: [id number]  HPI  patient is here today for annual physical. Patient feels well overall. Reviewed interval history, chronic conditions and current concerns   Past Medical History  Diagnosis Date  . Gestational diabetes mellitus in pregnancy 2009  . Pre-eclampsia 2009  . Anemia complicating childbirth 2505    transfused after 2009 C section.  . Acute gastric ulcers 08/11/2012    EGD 08/11/12: multiple gastric ulcers.  . Iron deficiency anemia     Ferritin <1 (08/2012)  . Thrombocytosis     ?Reactive in setting of peptic ulcers 08/2012.  Marland Kitchen Celiac disease     POSSIBLE - bx inconclusive 08/2012  . Anaphylactic reaction 08/12/2012    reaction to IV iron despite premed   Family History  Problem Relation Age of Onset  . Hypertension Mother   . Kidney disease Sister     IGA nephropathy, dialysis begun 07/2014  . Lymphoma Maternal Grandmother     non-hodgkins lymphoma  . Diabetes Maternal Grandfather   . Stroke Maternal Grandfather   . Colon cancer Neg Hx   . Esophageal cancer Neg Hx   . Rectal cancer Neg Hx   . Stomach cancer Neg Hx    History  Substance Use Topics  . Smoking status: Never Smoker   . Smokeless tobacco: Never Used  . Alcohol Use: 0.0 oz/week     Comment: minimal social ETOH    Review of Systems  Constitutional: Negative for fatigue and unexpected weight change.  Respiratory: Negative for cough, shortness of breath and wheezing.   Cardiovascular: Negative for chest pain, palpitations and leg swelling.  Gastrointestinal: Negative for nausea, abdominal pain and diarrhea.  Neurological: Negative for dizziness, weakness, light-headedness and headaches.  Psychiatric/Behavioral: Negative for dysphoric mood. The patient is not nervous/anxious.   All other systems reviewed and are negative.      Objective:    Physical Exam  Constitutional: She is oriented to person, place, and  time. She appears well-developed and well-nourished. No distress.  HENT:  Head: Normocephalic and atraumatic.  Right Ear: External ear normal.  Left Ear: External ear normal.  Nose: Nose normal.  Mouth/Throat: Oropharynx is clear and moist. No oropharyngeal exudate.  Eyes: EOM are normal. Pupils are equal, round, and reactive to light. Right eye exhibits no discharge. Left eye exhibits no discharge. No scleral icterus.  Neck: Normal range of motion. Neck supple. No JVD present. No tracheal deviation present. No thyromegaly present.  Cardiovascular: Normal rate, regular rhythm, normal heart sounds and intact distal pulses.  Exam reveals no friction rub.   No murmur heard. Pulmonary/Chest: Effort normal and breath sounds normal. No respiratory distress. She has no wheezes. She has no rales. She exhibits no tenderness.  Abdominal: Soft. Bowel sounds are normal. She exhibits no distension and no mass. There is no tenderness. There is no rebound and no guarding.  Genitourinary:  Defer to gyn  Musculoskeletal: Normal range of motion.  No gross deformities  Lymphadenopathy:    She has no cervical adenopathy.  Neurological: She is alert and oriented to person, place, and time. She has normal reflexes. No cranial nerve deficit.  Skin: Skin is warm and dry. No rash noted. She is not diaphoretic. No erythema.  Psychiatric: She has a normal mood and affect. Her behavior is normal. Judgment and thought content normal.  Nursing note and vitals reviewed.   BP 108/78  mmHg  Pulse 91  Temp(Src) 97.8 F (36.6 C) (Oral)  Ht 5' 6.25" (1.683 m)  Wt 150 lb 12 oz (68.38 kg)  BMI 24.14 kg/m2  SpO2 98%  LMP 08/06/2014 Wt Readings from Last 3 Encounters:  08/27/14 150 lb 12 oz (68.38 kg)  04/05/13 143 lb 12.8 oz (65.227 kg)  10/19/12 139 lb (63.05 kg)    Lab Results  Component Value Date   WBC 5.4 05/21/2014   HGB 12.7 05/21/2014   HCT 38.7 05/21/2014   PLT 365.0 05/21/2014   GLUCOSE 104*  05/21/2014   CHOL 175 05/21/2014   TRIG 63.0 05/21/2014   HDL 75.10 05/21/2014   LDLCALC 87 05/21/2014   ALT 30 05/21/2014   AST 25 05/21/2014   NA 140 05/21/2014   K 4.1 05/21/2014   CL 108 05/21/2014   CREATININE 0.76 05/21/2014   BUN 8 05/21/2014   CO2 23 05/21/2014   TSH 1.54 05/21/2014   HGBA1C 6.1 05/21/2014   Lab Results  Component Value Date   FERRITIN 5.3* 12/29/2013    Ct Abdomen Pelvis W Contrast  09/19/2012   *RADIOLOGY REPORT*  Clinical Data: Persistent left upper quadrant pain since appendectomy.  Recent endoscopy showed ulcers.  Possible celiac disease.  CT ABDOMEN AND PELVIS WITH CONTRAST  Technique:  Multidetector CT imaging of the abdomen and pelvis was performed following the standard protocol during bolus administration of intravenous contrast.  Contrast: 168m OMNIPAQUE IOHEXOL 300 MG/ML  SOLN  Comparison: None.  Findings: Lung bases show no acute findings.  Heart size normal. No pericardial or pleural effusion.  Liver, gallbladder, adrenal glands, kidneys, spleen and pancreas are unremarkable.  Stomach is decompressed.  Small bowel and colon are unremarkable.  Postoperative changes of cholecystectomy.  No pathologically enlarged lymph nodes.  Uterus and ovaries are visualized.  No free fluid.  No worrisome lytic or sclerotic lesions.  IMPRESSION: No acute findings.  No findings to explain the patient's given symptoms.   Original Report Authenticated By: MLorin Picket M.D.       Assessment & Plan:   CPX/z00.00 - Patient has been counseled on age-appropriate routine health concerns for screening and prevention. These are reviewed and up-to-date. Immunizations are up-to-date or declined. Labs ordered and reviewed.  Problem List Items Addressed This Visit    Celiac disease?    Left upper quadrant pain symptoms may 2014 initial improved with treatment of gastric ulcers and gluten-free diet Now intermittently following diet recommedations but feels symptoms largely  controlled No longer on SSRI (begun fall 2014 on advice of [facility] for treatment of "GI migraines") followup GI prn      Gestational diabetes mellitus in pregnancy    Hx same 2009 Check a1c annually The patient is asked to make an attempt to improve diet and exercise patterns to aid in medical management of this problem.  Lab Results  Component Value Date   HGBA1C 6.1 05/21/2014         Relevant Orders   Hemoglobin A1c   Iron deficiency anemia    Severe at diagnosis 08/2012 (ferritin <1) Related to celiac disease absorption and gastric ulcers as per EGD may 2014 Intolerant of IV iron due to anaphylaxis Inconsistent oral iron use Check CBC with ferritin now, consider resumed oral iron as needed      Relevant Orders   CBC with Differential/Platelet   Ferritin    Other Visit Diagnoses    Routine general medical examination at a health care facility    -  Primary        Gwendolyn Grant, MD

## 2014-08-27 NOTE — Patient Instructions (Addendum)
It was good to see you today.  We have reviewed your prior records including labs and tests today  Health Maintenance reviewed - all recommended immunizations and age-appropriate screenings are up-to-date.  Test(s) ordered today. Your results will be released to [place] (or called to you) after review, usually within 72hours after test completion. If any changes need to be made, you will be notified at that same time.  Medications reviewed and updated, no changes recommended at this time.  Please schedule followup in 12 months for annual exam and labs, call sooner if problems.  Health Maintenance Adopting a healthy lifestyle and getting preventive care can go a long way to promote health and wellness. Talk with your health care provider about what schedule of regular examinations is right for you. This is a good chance for you to check in with your provider about disease prevention and staying healthy. In between checkups, there are plenty of things you can do on your own. Experts have done a lot of research about which lifestyle changes and preventive measures are most likely to keep you healthy. Ask your health care provider for more information. WEIGHT AND DIET  Eat a healthy diet  Be sure to include plenty of vegetables, fruits, low-fat dairy products, and lean protein.  Do not eat a lot of foods high in solid fats, added sugars, or salt.  Get regular exercise. This is one of the most important things you can do for your health.  Most adults should exercise for at least 150 minutes each week. The exercise should increase your heart rate and make you sweat (moderate-intensity exercise).  Most adults should also do strengthening exercises at least twice a week. This is in addition to the moderate-intensity exercise.  Maintain a healthy weight  Body mass index (BMI) is a measurement that can be used to identify possible weight problems. It estimates body fat based on height and weight.  Your health care provider can help determine your BMI and help you achieve or maintain a healthy weight.  For females 20 years of age and older:   A BMI below 18.5 is considered underweight.  A BMI of 18.5 to 24.9 is normal.  A BMI of 25 to 29.9 is considered overweight.  A BMI of 30 and above is considered obese.  Watch levels of cholesterol and blood lipids  You should start having your blood tested for lipids and cholesterol at 36 years of age, then have this test every 5 years.  You may need to have your cholesterol levels checked more often if:  Your lipid or cholesterol levels are high.  You are older than 36 years of age.  You are at high risk for heart disease.  CANCER SCREENING   Lung Cancer  Lung cancer screening is recommended for adults 1-59 years old who are at high risk for lung cancer because of a history of smoking.  A yearly low-dose CT scan of the lungs is recommended for people who:  Currently smoke.  Have quit within the past 15 years.  Have at least a 30-pack-year history of smoking. A pack year is smoking an average of one pack of cigarettes a day for 1 year.  Yearly screening should continue until it has been 15 years since you quit.  Yearly screening should stop if you develop a health problem that would prevent you from having lung cancer treatment.  Breast Cancer  Practice breast self-awareness. This means understanding how your breasts normally appear and  feel.  It also means doing regular breast self-exams. Let your health care provider know about any changes, no matter how small.  If you are in your 20s or 30s, you should have a clinical breast exam (CBE) by a health care provider every 1-3 years as part of a regular health exam.  If you are 40 or older, have a CBE every year. Also consider having a breast X-ray (mammogram) every year.  If you have a family history of breast cancer, talk to your health care provider about genetic  screening.  If you are at high risk for breast cancer, talk to your health care provider about having an MRI and a mammogram every year.  Breast cancer gene (BRCA) assessment is recommended for women who have family members with BRCA-related cancers. BRCA-related cancers include:  Breast.  Ovarian.  Tubal.  Peritoneal cancers.  Results of the assessment will determine the need for genetic counseling and BRCA1 and BRCA2 testing. Cervical Cancer Routine pelvic examinations to screen for cervical cancer are no longer recommended for nonpregnant women who are considered low risk for cancer of the pelvic organs (ovaries, uterus, and vagina) and who do not have symptoms. A pelvic examination may be necessary if you have symptoms including those associated with pelvic infections. Ask your health care provider if a screening pelvic exam is right for you.   The Pap test is the screening test for cervical cancer for women who are considered at risk.  If you had a hysterectomy for a problem that was not cancer or a condition that could lead to cancer, then you no longer need Pap tests.  If you are older than 65 years, and you have had normal Pap tests for the past 10 years, you no longer need to have Pap tests.  If you have had past treatment for cervical cancer or a condition that could lead to cancer, you need Pap tests and screening for cancer for at least 20 years after your treatment.  If you no longer get a Pap test, assess your risk factors if they change (such as having a new sexual partner). This can affect whether you should start being screened again.  Some women have medical problems that increase their chance of getting cervical cancer. If this is the case for you, your health care provider may recommend more frequent screening and Pap tests.  The human papillomavirus (HPV) test is another test that may be used for cervical cancer screening. The HPV test looks for the virus that can  cause cell changes in the cervix. The cells collected during the Pap test can be tested for HPV.  The HPV test can be used to screen women 30 years of age and older. Getting tested for HPV can extend the interval between normal Pap tests from three to five years.  An HPV test also should be used to screen women of any age who have unclear Pap test results.  After 36 years of age, women should have HPV testing as often as Pap tests.  Colorectal Cancer  This type of cancer can be detected and often prevented.  Routine colorectal cancer screening usually begins at 36 years of age and continues through 36 years of age.  Your health care provider may recommend screening at an earlier age if you have risk factors for colon cancer.  Your health care provider may also recommend using home test kits to check for hidden blood in the stool.  A small camera   at the end of a tube can be used to examine your colon directly (sigmoidoscopy or colonoscopy). This is done to check for the earliest forms of colorectal cancer.  Routine screening usually begins at age 50.  Direct examination of the colon should be repeated every 5-10 years through 36 years of age. However, you may need to be screened more often if early forms of precancerous polyps or small growths are found. Skin Cancer  Check your skin from head to toe regularly.  Tell your health care provider about any new moles or changes in moles, especially if there is a change in a mole's shape or color.  Also tell your health care provider if you have a mole that is larger than the size of a pencil eraser.  Always use sunscreen. Apply sunscreen liberally and repeatedly throughout the day.  Protect yourself by wearing long sleeves, pants, a wide-brimmed hat, and sunglasses whenever you are outside. HEART DISEASE, DIABETES, AND HIGH BLOOD PRESSURE   Have your blood pressure checked at least every 1-2 years. High blood pressure causes heart  disease and increases the risk of stroke.  If you are between 55 years and 79 years old, ask your health care provider if you should take aspirin to prevent strokes.  Have regular diabetes screenings. This involves taking a blood sample to check your fasting blood sugar level.  If you are at a normal weight and have a low risk for diabetes, have this test once every three years after 36 years of age.  If you are overweight and have a high risk for diabetes, consider being tested at a younger age or more often. PREVENTING INFECTION  Hepatitis B  If you have a higher risk for hepatitis B, you should be screened for this virus. You are considered at high risk for hepatitis B if:  You were born in a country where hepatitis B is common. Ask your health care provider which countries are considered high risk.  Your parents were born in a high-risk country, and you have not been immunized against hepatitis B (hepatitis B vaccine).  You have HIV or AIDS.  You use needles to inject street drugs.  You live with someone who has hepatitis B.  You have had sex with someone who has hepatitis B.  You get hemodialysis treatment.  You take certain medicines for conditions, including cancer, organ transplantation, and autoimmune conditions. Hepatitis C  Blood testing is recommended for:  Everyone born from 1945 through 1965.  Anyone with known risk factors for hepatitis C. Sexually transmitted infections (STIs)  You should be screened for sexually transmitted infections (STIs) including gonorrhea and chlamydia if:  You are sexually active and are younger than 36 years of age.  You are older than 36 years of age and your health care provider tells you that you are at risk for this type of infection.  Your sexual activity has changed since you were last screened and you are at an increased risk for chlamydia or gonorrhea. Ask your health care provider if you are at risk.  If you do not have  HIV, but are at risk, it may be recommended that you take a prescription medicine daily to prevent HIV infection. This is called pre-exposure prophylaxis (PrEP). You are considered at risk if:  You are sexually active and do not regularly use condoms or know the HIV status of your partner(s).  You take drugs by injection.  You are sexually active with a partner   who has HIV. Talk with your health care provider about whether you are at high risk of being infected with HIV. If you choose to begin PrEP, you should first be tested for HIV. You should then be tested every 3 months for as long as you are taking PrEP.  PREGNANCY   If you are premenopausal and you may become pregnant, ask your health care provider about preconception counseling.  If you may become pregnant, take 400 to 800 micrograms (mcg) of folic acid every day.  If you want to prevent pregnancy, talk to your health care provider about birth control (contraception). OSTEOPOROSIS AND MENOPAUSE   Osteoporosis is a disease in which the bones lose minerals and strength with aging. This can result in serious bone fractures. Your risk for osteoporosis can be identified using a bone density scan.  If you are 65 years of age or older, or if you are at risk for osteoporosis and fractures, ask your health care provider if you should be screened.  Ask your health care provider whether you should take a calcium or vitamin D supplement to lower your risk for osteoporosis.  Menopause may have certain physical symptoms and risks.  Hormone replacement therapy may reduce some of these symptoms and risks. Talk to your health care provider about whether hormone replacement therapy is right for you.  HOME CARE INSTRUCTIONS   Schedule regular health, dental, and eye exams.  Stay current with your immunizations.   Do not use any tobacco products including cigarettes, chewing tobacco, or electronic cigarettes.  If you are pregnant, do not  drink alcohol.  If you are breastfeeding, limit how much and how often you drink alcohol.  Limit alcohol intake to no more than 1 drink per day for nonpregnant women. One drink equals 12 ounces of beer, 5 ounces of wine, or 1 ounces of hard liquor.  Do not use street drugs.  Do not share needles.  Ask your health care provider for help if you need support or information about quitting drugs.  Tell your health care provider if you often feel depressed.  Tell your health care provider if you have ever been abused or do not feel safe at home. Document Released: 10/13/2010 Document Revised: 08/14/2013 Document Reviewed: 03/01/2013 ExitCare Patient Information 2015 [employer]. This information is not intended to replace advice given to you by your health care provider. Make sure you discuss any questions you have with your health care provider.  

## 2014-08-27 NOTE — Progress Notes (Signed)
Pre visit review using our clinic review tool, if applicable. No additional management support is needed unless otherwise documented below in the visit note. 

## 2014-08-27 NOTE — Assessment & Plan Note (Signed)
Severe at diagnosis 08/2012 (ferritin <1) Related to celiac disease absorption and gastric ulcers as per EGD may 2014 Intolerant of IV iron due to anaphylaxis Inconsistent oral iron use Check CBC with ferritin now, consider resumed oral iron as needed

## 2015-05-20 DIAGNOSIS — N939 Abnormal uterine and vaginal bleeding, unspecified: Secondary | ICD-10-CM | POA: Diagnosis not present

## 2015-05-30 ENCOUNTER — Encounter: Payer: Self-pay | Admitting: *Deleted

## 2015-05-30 ENCOUNTER — Encounter: Payer: 59 | Attending: Obstetrics and Gynecology | Admitting: *Deleted

## 2015-05-30 VITALS — Ht 65.0 in | Wt 154.0 lb

## 2015-05-30 DIAGNOSIS — R7309 Other abnormal glucose: Secondary | ICD-10-CM | POA: Diagnosis not present

## 2015-05-30 DIAGNOSIS — E663 Overweight: Secondary | ICD-10-CM

## 2015-05-30 NOTE — Patient Instructions (Signed)
Plan:  Aim for 2 Carb Choices per meal (30 grams) +/- 1 either way  Aim for 0-1 Carbs per snack if hungry  Include lean protein in moderation with your meals and snacks Consider reading food labels for Total Carbohydrate of foods Continue with your activity level daily as tolerated Consider checking BG at alternate times per day on occasion

## 2015-05-30 NOTE — Progress Notes (Signed)
  Medical Nutrition Therapy:  Appt start time: 0930 end time:  1030.  Assessment:  Primary concerns today: weight mamnagement and pre-diabetes education. She has history of GDM when she had her twins in 2009 and has family history of DM 2. 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 She is interested in weight management to prevent or postpone Diabetes  Preferred Learning Style:   No preference indicated   Learning Readiness:   Ready  Change in progress  MEDICATIONS: see list   DIETARY INTAKE:  Usual eating pattern includes 2 meals and 1-2 snacks per day.  Everyday foods include good variety of all food groups.  Avoided foods include eggs.    24-hr recall:  B ( AM): skipped  Snk ( AM): no  L ( PM): Dr's lounge food: whole grain starches, lean meats, vegetables and salad Snk ( PM): occasionally small candy bar (bite sized) D ( PM): lean meat, starch x 1-2, always vegetables Snk ( PM): ice cream sandwich maybe 30% of time Beverages: water, coke zero  Usual physical activity: walks at work in hospital daily, [employer] 35 minutes cardio 3-4 days a week and weights 2-3 days a week since end of December  Estimated energy needs: 1400 calories 158 g carbohydrates 105 g protein 39 g fat  Progress Towards Goal(s):  In progress.   Nutritional Diagnosis:  NI-1.5 Excessive energy intake As related to activity level.  As evidenced by BMI of 25.7.    Intervention:  Nutrition counseling for weight management initiated. Discussed Carb Counting by food group as method of portion control, reading food labels, and benefits of increased activity. Encouraged increased acitivity level as able with heavy work schedule and busy family life.   Teaching Method Utilized: Visual, Auditory and Hands on  Handouts given during visit include: Carb Counting and Food Label handouts Meal Plan Card  Barriers to learning/adherence to lifestyle change: none  Demonstrated degree of understanding via:  Teach  Back   Monitoring/Evaluation:  Dietary intake, exercise, reading food labels, and body weight prn.

## 2015-07-08 ENCOUNTER — Telehealth: Payer: Self-pay | Admitting: Family Medicine

## 2015-07-08 NOTE — Telephone Encounter (Signed)
Pt has been scheduled on 09/13/15 @ 1:30.

## 2015-07-08 NOTE — Telephone Encounter (Signed)
Ok  establish

## 2015-07-08 NOTE — Telephone Encounter (Signed)
Pt wanting to trans care. Is this ok to schedule?

## 2015-07-19 DIAGNOSIS — N938 Other specified abnormal uterine and vaginal bleeding: Secondary | ICD-10-CM | POA: Diagnosis not present

## 2015-07-19 DIAGNOSIS — N920 Excessive and frequent menstruation with regular cycle: Secondary | ICD-10-CM | POA: Diagnosis not present

## 2015-08-18 ENCOUNTER — Telehealth: Payer: 59 | Admitting: Family

## 2015-08-18 DIAGNOSIS — H1089 Other conjunctivitis: Secondary | ICD-10-CM

## 2015-08-18 DIAGNOSIS — H109 Unspecified conjunctivitis: Secondary | ICD-10-CM

## 2015-08-18 DIAGNOSIS — A499 Bacterial infection, unspecified: Secondary | ICD-10-CM | POA: Diagnosis not present

## 2015-08-18 MED ORDER — POLYMYXIN B-TRIMETHOPRIM 10000-0.1 UNIT/ML-% OP SOLN: 2.0000 [drp] | OPHTHALMIC | Status: DC

## 2015-08-18 NOTE — Progress Notes (Signed)

## 2015-09-12 HISTORY — PX: OTHER SURGICAL HISTORY: SHX169

## 2015-09-12 LAB — HM PAP SMEAR: HM Pap smear: NORMAL

## 2015-09-13 ENCOUNTER — Ambulatory Visit: Payer: 59 | Admitting: Family Medicine

## 2015-10-01 DIAGNOSIS — N920 Excessive and frequent menstruation with regular cycle: Secondary | ICD-10-CM | POA: Diagnosis not present

## 2015-10-01 DIAGNOSIS — Z3202 Encounter for pregnancy test, result negative: Secondary | ICD-10-CM | POA: Diagnosis not present

## 2015-11-14 ENCOUNTER — Encounter: Payer: Self-pay | Admitting: Family Medicine

## 2015-11-14 ENCOUNTER — Ambulatory Visit (INDEPENDENT_AMBULATORY_CARE_PROVIDER_SITE_OTHER): Payer: 59 | Admitting: Family Medicine

## 2015-11-14 DIAGNOSIS — Z0000 Encounter for general adult medical examination without abnormal findings: Secondary | ICD-10-CM | POA: Diagnosis not present

## 2015-11-14 NOTE — Progress Notes (Signed)
Pre visit review using our clinic review tool, if applicable. No additional management support is needed unless otherwise documented below in the visit note. 

## 2015-11-14 NOTE — Patient Instructions (Signed)
Follow up in 6 months to recheck sugar Continue to work on healthy diet and regular exercise- you can do it!! Call with any questions or concerns Welcome!  We're glad to have you!!!

## 2015-11-14 NOTE — Assessment & Plan Note (Signed)
Pt's PE WNL.  UTD on GYN.  Labs done recently- will attempt to get results from Dr Garwin Brothers.  Anticipatory guidance provided.

## 2015-11-14 NOTE — Progress Notes (Signed)
   Subjective:    Patient ID: Deborah Salinas, female    DOB: [birth year], 35 y.o.   MRN: [id number]  HPI New to establish.  Previous MD- Asa Lente.  GYN- Cousins  CPE- UTD on pap.  No concerns today.  Pt had lab work done w/ GYN- most recent A1C 6.3   Review of Systems Patient reports no vision/ hearing changes, adenopathy,fever, weight change,  persistant/recurrent hoarseness , swallowing issues, chest pain, palpitations, edema, persistant/recurrent cough, hemoptysis, dyspnea (rest/exertional/paroxysmal nocturnal), gastrointestinal bleeding (melena, rectal bleeding), abdominal pain, significant heartburn, bowel changes, GU symptoms (dysuria, hematuria, incontinence), Gyn symptoms (abnormal  bleeding, pain),  syncope, focal weakness, memory loss, numbness & tingling, skin/hair/nail changes, abnormal bruising or bleeding, anxiety, or depression.     Objective:   Physical Exam General Appearance:    Alert, cooperative, no distress, appears stated age  Head:    Normocephalic, without obvious abnormality, atraumatic  Eyes:    PERRL, conjunctiva/corneas clear, EOM's intact, fundi    benign, both eyes  Ears:    Normal TM's and external ear canals, both ears  Nose:   Nares normal, septum midline, mucosa normal, no drainage    or sinus tenderness  Throat:   Lips, mucosa, and tongue normal; teeth and gums normal  Neck:   Supple, symmetrical, trachea midline, no adenopathy;    Thyroid: no enlargement/tenderness/nodules  Back:     Symmetric, no curvature, ROM normal, no CVA tenderness  Lungs:     Clear to auscultation bilaterally, respirations unlabored  Chest Wall:    No tenderness or deformity   Heart:    Regular rate and rhythm, S1 and S2 normal, no murmur, rub   or gallop  Breast Exam:    Deferred to GYN  Abdomen:     Soft, non-tender, bowel sounds active all four quadrants,    no masses, no organomegaly  Genitalia:    Deferred to GYN  Rectal:    Extremities:   Extremities normal, atraumatic,  no cyanosis or edema  Pulses:   2+ and symmetric all extremities  Skin:   Skin color, texture, turgor normal, no rashes or lesions  Lymph nodes:   Cervical, supraclavicular, and axillary nodes normal  Neurologic:   CNII-XII intact, normal strength, sensation and reflexes    throughout          Assessment & Plan:

## 2016-05-25 ENCOUNTER — Telehealth: Payer: Self-pay | Admitting: Family Medicine

## 2016-05-25 NOTE — Telephone Encounter (Signed)
I have patient scheduled for next Thursday.  Would be a new patient for knee injury.  Would like to know if could get worked in sooner.

## 2016-05-28 ENCOUNTER — Ambulatory Visit: Payer: 59 | Admitting: Family Medicine

## 2016-06-01 ENCOUNTER — Ambulatory Visit (INDEPENDENT_AMBULATORY_CARE_PROVIDER_SITE_OTHER): Payer: 59 | Admitting: Family Medicine

## 2016-06-01 ENCOUNTER — Encounter: Payer: Self-pay | Admitting: Family Medicine

## 2016-06-01 VITALS — BP 100/68 | HR 86 | Temp 98.1°F | Resp 16 | Ht 65.0 in | Wt 158.4 lb

## 2016-06-01 DIAGNOSIS — Z8632 Personal history of gestational diabetes: Secondary | ICD-10-CM

## 2016-06-01 DIAGNOSIS — D509 Iron deficiency anemia, unspecified: Secondary | ICD-10-CM

## 2016-06-01 LAB — CBC WITH DIFFERENTIAL/PLATELET
BASOS ABS: 0.1 10*3/uL (ref 0.0–0.1)
Basophils Relative: 1 % (ref 0.0–3.0)
Eosinophils Absolute: 0.1 10*3/uL (ref 0.0–0.7)
Eosinophils Relative: 0.7 % (ref 0.0–5.0)
HCT: 39.9 % (ref 36.0–46.0)
HEMOGLOBIN: 13.4 g/dL (ref 12.0–15.0)
Lymphocytes Relative: 31 % (ref 12.0–46.0)
Lymphs Abs: 2.4 10*3/uL (ref 0.7–4.0)
MCHC: 33.5 g/dL (ref 30.0–36.0)
MCV: 83.3 fl (ref 78.0–100.0)
Monocytes Absolute: 0.6 10*3/uL (ref 0.1–1.0)
Monocytes Relative: 8.2 % (ref 3.0–12.0)
Neutro Abs: 4.6 10*3/uL (ref 1.4–7.7)
Neutrophils Relative %: 59.1 % (ref 43.0–77.0)
Platelets: 402 10*3/uL — ABNORMAL HIGH (ref 150.0–400.0)
RBC: 4.79 Mil/uL (ref 3.87–5.11)
RDW: 13.9 % (ref 11.5–15.5)
WBC: 7.8 10*3/uL (ref 4.0–10.5)

## 2016-06-01 LAB — BASIC METABOLIC PANEL
BUN: 8 mg/dL (ref 6–23)
CO2: 24 mEq/L (ref 19–32)
CREATININE: 0.69 mg/dL (ref 0.40–1.20)
Calcium: 9.2 mg/dL (ref 8.4–10.5)
Chloride: 106 mEq/L (ref 96–112)
GFR: 101.41 mL/min (ref >60.00)
Glucose, Bld: 78 mg/dL (ref 70–99)
POTASSIUM: 4.1 meq/L (ref 3.5–5.1)
Sodium: 138 mEq/L (ref 135–145)

## 2016-06-01 LAB — HEMOGLOBIN A1C: HEMOGLOBIN A1C: 6.1 % (ref 4.6–6.5)

## 2016-06-01 NOTE — Assessment & Plan Note (Signed)
Pt has hx of this w/ her twin pregnancy.  Pt reports home CBGs are consistently around 108.  Denies symptoms concerning for diabetes- no polydipsia, polyuria, polyphagia.  Check labs.  Adjust tx prn

## 2016-06-01 NOTE — Assessment & Plan Note (Signed)
Pt has hx of this after gastric ulcer and celiac disease.  Some fatigue.  Repeat CBC and restart iron prn.  Pt expressed understanding and is in agreement w/ plan.

## 2016-06-01 NOTE — Progress Notes (Signed)
Pre visit review using our clinic review tool, if applicable. No additional management support is needed unless otherwise documented below in the visit note. 

## 2016-06-01 NOTE — Patient Instructions (Signed)
Schedule your complete physical in 6 months We'll notify you of your lab results and make any changes if needed Keep up the good work on healthy diet and regular exercise- you look great!!! Call with any questions or concerns [place]!!!

## 2016-06-01 NOTE — Progress Notes (Signed)
   Subjective:    Patient ID: Deborah Salinas, female    DOB: May 24, 1980, 36 y.o.   MRN: [id number]  HPI Elevated glucose- pt has hx of gestational diabetes.  Pt's most recent A1C w/ GYN was 6.3  Pt is now running 30 minutes/day.  Pt is now in [place] program through [place] and home CBGs are consistently ~108.  No CP, SOB, HAs, visual changes, abd pain, N/V, numbness/tingling of hands/feet.  Anemia- pt has hx of gastric ulcer and has some ongoing fatigue.  Due for repeat CBC.   Review of Systems For ROS see HPI     Objective:   Physical Exam  Constitutional: She is oriented to person, place, and time. She appears well-developed and well-nourished. No distress.  HENT:  Head: Normocephalic and atraumatic.  Eyes: Conjunctivae and EOM are normal. Pupils are equal, round, and reactive to light.  Neck: Normal range of motion. Neck supple. No thyromegaly present.  Cardiovascular: Normal rate, regular rhythm, normal heart sounds and intact distal pulses.   No murmur heard. Pulmonary/Chest: Effort normal and breath sounds normal. No respiratory distress.  Abdominal: Soft. She exhibits no distension. There is no tenderness.  Musculoskeletal: She exhibits no edema.  Lymphadenopathy:    She has no cervical adenopathy.  Neurological: She is alert and oriented to person, place, and time.  Skin: Skin is warm and dry.  Psychiatric: She has a normal mood and affect. Her behavior is normal.  Vitals reviewed.         Assessment & Plan:

## 2016-06-02 NOTE — Telephone Encounter (Signed)
Spoke to pt, she states she is feeling much better & does not need an appt at this time.

## 2016-06-04 ENCOUNTER — Ambulatory Visit: Payer: 59 | Admitting: Family Medicine

## 2016-09-03 ENCOUNTER — Telehealth: Payer: 59 | Admitting: Family

## 2016-09-03 DIAGNOSIS — L239 Allergic contact dermatitis, unspecified cause: Secondary | ICD-10-CM | POA: Diagnosis not present

## 2016-09-03 MED ORDER — PREDNISONE 10 MG PO TABS: 10.0000 mg | ORAL_TABLET | ORAL | 0 refills | Status: DC

## 2016-09-03 NOTE — Progress Notes (Signed)
Thank you for the details you put in the comment boxes. Those details really help [place] take better care of you. This looks more like an allergic reaction to me.     E Visit for Rash  We are sorry that you are not feeling well. Here is how we plan to help!   Sterapred 10 mg dose pak    HOME CARE:   Take cool showers and avoid direct sunlight.  Apply cool compress or wet dressings.  Take a bath in an oatmeal bath.  Sprinkle content of one Aveeno packet under running faucet with comfortably warm water.  Bathe for 15-20 minutes, 1-2 times daily.  Pat dry with a towel. Do not rub the rash.  Use hydrocortisone cream.  Take an antihistamine like Benadryl for widespread rashes that itch.  The adult dose of Benadryl is 25-50 mg by mouth 4 times daily.  Caution:  This type of medication may cause sleepiness.  Do not drink alcohol, drive, or operate dangerous machinery while taking antihistamines.  Do not take these medications if you have prostate enlargement.  Read package instructions thoroughly on all medications that you take.  GET HELP RIGHT AWAY IF:   Symptoms don't go away after treatment.  Severe itching that persists.  If you rash spreads or swells.  If you rash begins to smell.  If it blisters and opens or develops a yellow-brown crust.  You develop a fever.  You have a sore throat.  You become short of breath.  MAKE SURE YOU:  Understand these instructions. Will watch your condition. Will get help right away if you are not doing well or get worse.  Thank you for choosing an e-visit. Your e-visit answers were reviewed by a board certified advanced clinical practitioner to complete your personal care plan. Depending upon the condition, your plan could have included both over the counter or prescription medications. Please review your pharmacy choice. Be sure that the pharmacy you have chosen is open so that you can pick up your prescription now.  If there is a  problem you may message your provider in [place] to have the prescription routed to another pharmacy. Your safety is important to [place]. If you have drug allergies check your prescription carefully.  For the next 24 hours, you can use MyChart to ask questions about today's visit, request a non-urgent call back, or ask for a work or school excuse from your e-visit provider. You will get an email in the next two days asking about your experience. I hope that your e-visit has been valuable and will speed your recovery.

## 2016-09-09 ENCOUNTER — Encounter: Payer: Self-pay | Admitting: Physician Assistant

## 2016-09-09 ENCOUNTER — Ambulatory Visit (INDEPENDENT_AMBULATORY_CARE_PROVIDER_SITE_OTHER): Payer: 59 | Admitting: Physician Assistant

## 2016-09-09 VITALS — BP 102/70 | HR 99 | Temp 98.3°F | Resp 14 | Ht 65.0 in | Wt 162.0 lb

## 2016-09-09 DIAGNOSIS — M791 Myalgia, unspecified site: Secondary | ICD-10-CM

## 2016-09-09 LAB — BASIC METABOLIC PANEL
BUN: 10 mg/dL (ref 4–21)
Creatinine: 1 mg/dL (ref 0.5–1.1)
GLUCOSE: 88 mg/dL
Potassium: 4.1 mmol/L (ref 3.4–5.3)
Sodium: 138 mmol/L (ref 137–147)

## 2016-09-09 LAB — CBC WITH DIFFERENTIAL/PLATELET
BASOS PCT: 1 %
Basophils Absolute: 131 cells/uL (ref 0–200)
EOS ABS: 262 {cells}/uL (ref 15–500)
EOS PCT: 2 %
HCT: 42.2 % (ref 35.0–45.0)
Hemoglobin: 14 g/dL (ref 11.7–15.5)
LYMPHS ABS: 3668 {cells}/uL (ref 850–3900)
Lymphocytes Relative: 28 %
MCH: 27.8 pg (ref 27.0–33.0)
MCHC: 33.2 g/dL (ref 32.0–36.0)
MCV: 83.7 fL (ref 80.0–100.0)
MONOS PCT: 11 %
MPV: 9.6 fL (ref 7.5–12.5)
Monocytes Absolute: 1441 cells/uL — ABNORMAL HIGH (ref 200–950)
NEUTROS ABS: 7598 {cells}/uL (ref 1500–7800)
Neutrophils Relative %: 58 %
PLATELETS: 414 10*3/uL — AB (ref 140–400)
RBC: 5.04 MIL/uL (ref 3.80–5.10)
RDW: 14.4 % (ref 11.0–15.0)
WBC: 13.1 10*3/uL — ABNORMAL HIGH (ref 3.8–10.8)

## 2016-09-09 LAB — COMPREHENSIVE METABOLIC PANEL
ALK PHOS: 84 U/L (ref 33–115)
ALT: 16 U/L (ref 6–29)
AST: 12 U/L (ref 10–30)
Albumin: 3.8 g/dL (ref 3.6–5.1)
BUN: 10 mg/dL (ref 7–25)
CO2: 27 mmol/L (ref 20–31)
CREATININE: 0.97 mg/dL (ref 0.50–1.10)
Calcium: 9 mg/dL (ref 8.6–10.2)
Chloride: 104 mmol/L (ref 98–110)
Glucose, Bld: 88 mg/dL (ref 65–99)
Potassium: 4.1 mmol/L (ref 3.5–5.3)
SODIUM: 138 mmol/L (ref 135–146)
Total Bilirubin: 0.3 mg/dL (ref 0.2–1.2)
Total Protein: 6.8 g/dL (ref 6.1–8.1)

## 2016-09-09 LAB — HEPATIC FUNCTION PANEL
ALK PHOS: 84 U/L (ref 25–125)
ALT: 16 U/L (ref 7–35)
AST: 12 U/L — AB (ref 13–35)
BILIRUBIN, TOTAL: 0.3 mg/dL

## 2016-09-09 LAB — CBC AND DIFFERENTIAL
HCT: 42 % (ref 36–46)
HCT: 42 % (ref 36–46)
Hemoglobin: 14 g/dL (ref 12.0–16.0)
Hemoglobin: 14 g/dL (ref 12.0–16.0)
Neutrophils Absolute: 7598 /uL
Neutrophils Absolute: 7958 /uL
PLATELETS: 414 10*3/uL — AB (ref 150–399)
Platelets: 414 10*3/uL — AB (ref 150–399)
WBC: 13.1 10^3/mL
WBC: 13.1 10^3/mL

## 2016-09-09 LAB — SEDIMENTATION RATE: SED RATE: 9 mm/h (ref 0–20)

## 2016-09-09 LAB — POCT ERYTHROCYTE SEDIMENTATION RATE, NON-AUTOMATED: SED RATE: 9 mm

## 2016-09-09 LAB — CK: Total CK: 29 U/L (ref 29–143)

## 2016-09-09 NOTE — Patient Instructions (Signed)
Can use Naproxen or Ibuprofen if needed for pain.  Apply heating pad to the area.  Hydrate and rest.  You can use topical steroid to help with the rash. I am concerned the oral steroids were suppressing an autoimmune issue.  We are doing a further workup.  If there is any acute worsening of symptoms before workup is complete, please go to the ER.

## 2016-09-09 NOTE — Progress Notes (Signed)
Pre visit review using our clinic review tool, if applicable. No additional management support is needed unless otherwise documented below in the visit note. 

## 2016-09-09 NOTE — Progress Notes (Signed)
Patient presents to clinic today c/o muscle aches of upper body and torso starting last night and worsened today. Patient states she was just treated for an allergic dermatitis with a 5-day prednisone taper. Noted significant improvement in the rash with medication but without complete resolution. Last steroid was yesterday morning. States symptoms started last night. Denies fever, chills, abdominal discomfort, nausea/vomiting. Denies change in urination, especially hematuria. Has questionable history of celiac disease with prior GI workup. Denies any known history of other AI illness.    Past Medical History:  Diagnosis Date  . Acute gastric ulcers 08/11/2012   EGD 08/11/12: multiple gastric ulcers.  . Anaphylactic reaction 08/12/2012   reaction to IV iron despite premed  . Anemia complicating childbirth 4098   transfused after 2009 C section.  . Celiac disease    POSSIBLE - bx inconclusive 08/2012  . Gestational diabetes mellitus in pregnancy 2009  . Iron deficiency anemia    Ferritin <1 (08/2012)  . Pre-eclampsia 2009  . Thrombocytosis ([place])    ?Reactive in setting of peptic ulcers 08/2012.    No current outpatient prescriptions on file prior to visit.   No current facility-administered medications on file prior to visit.     Allergies  Allergen Reactions  . Dextrans Anaphylaxis  . Nulecit [Na Ferric Gluc Cplx In Sucrose] Anaphylaxis  . Erythromycin Rash  . Sulfa Antibiotics Rash    Family History  Problem Relation Age of Onset  . Hypertension Mother   . Lymphoma Maternal Grandmother        non-hodgkins lymphoma  . Diabetes Maternal Grandfather   . Stroke Maternal Grandfather   . Colon cancer Neg Hx   . Esophageal cancer Neg Hx   . Rectal cancer Neg Hx   . Stomach cancer Neg Hx     Social History   Social History  . Marital status: Married    Spouse name: LEAUNA SHARBER  . Number of children: 3  . Years of education: N/A   Occupational History  . nurse [employer]    Social History Main Topics  . Smoking status: Never Smoker  . Smokeless tobacco: Never Used  . Alcohol use 0.0 oz/week     Comment: minimal social ETOH  . Drug use: No  . Sexual activity: Yes    Birth control/ protection: Surgical   Other Topics Concern  . 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  All other ROS are negative.  BP 102/70   Pulse 99   Temp 98.3 F (36.8 C) (Oral)   Resp 14   Ht 5' 5"  (1.651 m)   Wt 162 lb (73.5 kg)   SpO2 99%   BMI 26.96 kg/m   Physical Exam  Constitutional: She is oriented to person, place, and time and well-developed, well-nourished, and in no distress.  HENT:  Head: Normocephalic and atraumatic.  Eyes: Conjunctivae are normal.  Neck: Neck supple.  Cardiovascular: Normal rate, regular rhythm, normal heart sounds and intact distal pulses.   Neurological: She is alert and oriented to person, place, and time. She exhibits normal muscle tone.  Skin: Skin is warm and dry.  Psychiatric: Affect normal.  Vitals reviewed.   Assessment/Plan: 1. Myalgia Unclear. Concern for underlying AI illness that the steroid was effecting and now that the steroid has been removed a flare has occurred. Rash remnant consistent with a contact dermatitis. Clobetasol for this. Labs today  as below. NSAID for pain. Supportive measures reviewed. ER if any worsening of myalgias before lab results.  - CK (Creatine Kinase) - Sed Rate (ESR) - Antinuclear Antib (ANA) - CBC with Differential/Platelet - Comp Met (CMET)   Leeanne Rio, PA-C

## 2016-09-10 ENCOUNTER — Emergency Department (HOSPITAL_BASED_OUTPATIENT_CLINIC_OR_DEPARTMENT_OTHER)
Admission: EM | Admit: 2016-09-10 | Discharge: 2016-09-10 | Disposition: A | Discharge status: Home / Self Care | Payer: 59 | Attending: Emergency Medicine | Admitting: Emergency Medicine

## 2016-09-10 ENCOUNTER — Telehealth: Payer: Self-pay | Admitting: General Practice

## 2016-09-10 ENCOUNTER — Emergency Department (HOSPITAL_BASED_OUTPATIENT_CLINIC_OR_DEPARTMENT_OTHER): Payer: 59

## 2016-09-10 ENCOUNTER — Encounter (HOSPITAL_BASED_OUTPATIENT_CLINIC_OR_DEPARTMENT_OTHER): Payer: Self-pay | Admitting: *Deleted

## 2016-09-10 DIAGNOSIS — N83202 Unspecified ovarian cyst, left side: Secondary | ICD-10-CM | POA: Insufficient documentation

## 2016-09-10 DIAGNOSIS — Z8632 Personal history of gestational diabetes: Secondary | ICD-10-CM | POA: Diagnosis not present

## 2016-09-10 DIAGNOSIS — R109 Unspecified abdominal pain: Secondary | ICD-10-CM | POA: Diagnosis not present

## 2016-09-10 DIAGNOSIS — K824 Cholesterolosis of gallbladder: Secondary | ICD-10-CM | POA: Insufficient documentation

## 2016-09-10 DIAGNOSIS — R1110 Vomiting, unspecified: Secondary | ICD-10-CM | POA: Diagnosis not present

## 2016-09-10 DIAGNOSIS — R52 Pain, unspecified: Secondary | ICD-10-CM

## 2016-09-10 DIAGNOSIS — M791 Myalgia, unspecified site: Secondary | ICD-10-CM

## 2016-09-10 DIAGNOSIS — R203 Hyperesthesia: Secondary | ICD-10-CM | POA: Diagnosis not present

## 2016-09-10 DIAGNOSIS — R0602 Shortness of breath: Secondary | ICD-10-CM | POA: Diagnosis not present

## 2016-09-10 DIAGNOSIS — T7840XA Allergy, unspecified, initial encounter: Secondary | ICD-10-CM | POA: Diagnosis not present

## 2016-09-10 DIAGNOSIS — R238 Other skin changes: Secondary | ICD-10-CM

## 2016-09-10 LAB — ANA: Anti Nuclear Antibody(ANA): NEGATIVE

## 2016-09-10 LAB — CBC WITH DIFFERENTIAL/PLATELET
BASOS ABS: 0 10*3/uL (ref 0.0–0.1)
Basophils Relative: 0 %
EOS ABS: 0.2 10*3/uL (ref 0.0–0.7)
EOS PCT: 2 %
HCT: 42.6 % (ref 36.0–46.0)
Hemoglobin: 14.5 g/dL (ref 12.0–15.0)
Lymphocytes Relative: 27 %
Lymphs Abs: 3.3 10*3/uL (ref 0.7–4.0)
MCH: 27.9 pg (ref 26.0–34.0)
MCHC: 34 g/dL (ref 30.0–36.0)
MCV: 81.9 fL (ref 78.0–100.0)
MONO ABS: 1.1 10*3/uL — AB (ref 0.1–1.0)
Monocytes Relative: 9 %
Neutro Abs: 7.5 10*3/uL (ref 1.7–7.7)
Neutrophils Relative %: 62 %
PLATELETS: 434 10*3/uL — AB (ref 150–400)
RBC: 5.2 MIL/uL — AB (ref 3.87–5.11)
RDW: 13.5 % (ref 11.5–15.5)
WBC: 12.1 10*3/uL — ABNORMAL HIGH (ref 4.0–10.5)

## 2016-09-10 LAB — COMPREHENSIVE METABOLIC PANEL
ALK PHOS: 84 U/L (ref 38–126)
ALT: 31 U/L (ref 14–54)
AST: 31 U/L (ref 15–41)
Albumin: 3.6 g/dL (ref 3.5–5.0)
Anion gap: 13 (ref 5–15)
BILIRUBIN TOTAL: 0.6 mg/dL (ref 0.3–1.2)
BUN: 10 mg/dL (ref 6–20)
CALCIUM: 8.7 mg/dL — AB (ref 8.9–10.3)
CO2: 19 mmol/L — ABNORMAL LOW (ref 22–32)
CREATININE: 0.68 mg/dL (ref 0.44–1.00)
Chloride: 105 mmol/L (ref 101–111)
Glucose, Bld: 93 mg/dL (ref 65–99)
Potassium: 3.6 mmol/L (ref 3.5–5.1)
Sodium: 137 mmol/L (ref 135–145)
TOTAL PROTEIN: 7 g/dL (ref 6.5–8.1)

## 2016-09-10 LAB — URINALYSIS, ROUTINE W REFLEX MICROSCOPIC
Bilirubin Urine: NEGATIVE
Glucose, UA: NEGATIVE mg/dL
Hgb urine dipstick: NEGATIVE
Ketones, ur: 15 mg/dL — AB
LEUKOCYTES UA: NEGATIVE
NITRITE: NEGATIVE
PROTEIN: NEGATIVE mg/dL
SPECIFIC GRAVITY, URINE: 1.007 (ref 1.005–1.030)
pH: 7.5 (ref 5.0–8.0)

## 2016-09-10 LAB — I-STAT CG4 LACTIC ACID, ED: LACTIC ACID, VENOUS: 1.86 mmol/L (ref 0.5–1.9)

## 2016-09-10 LAB — LIPASE, BLOOD: LIPASE: 112 U/L — AB (ref 11–51)

## 2016-09-10 LAB — CK: CK TOTAL: 32 U/L — AB (ref 38–234)

## 2016-09-10 LAB — HCG, SERUM, QUALITATIVE: PREG SERUM: NEGATIVE

## 2016-09-10 MED ORDER — PROMETHAZINE HCL 25 MG/ML IJ SOLN: 25.0000 mg | Freq: Once | INTRAMUSCULAR | Status: DC

## 2016-09-10 MED ORDER — HYDROMORPHONE HCL 1 MG/ML IJ SOLN
1.0000 mg | Freq: Once | INTRAMUSCULAR | Status: AC
  Administered 2016-09-10: 1 mg via INTRAVENOUS
  Filled 2016-09-10: qty 1

## 2016-09-10 MED ORDER — ONDANSETRON HCL 4 MG/2ML IJ SOLN
4.0000 mg | Freq: Once | INTRAMUSCULAR | Status: AC
  Administered 2016-09-10: 4 mg via INTRAVENOUS
  Filled 2016-09-10: qty 2

## 2016-09-10 MED ORDER — CLOBETASOL PROPIONATE 0.05 % EX OINT: 1.0000 "application " | TOPICAL_OINTMENT | Freq: Two times a day (BID) | CUTANEOUS | 0 refills | Status: DC

## 2016-09-10 MED ORDER — IOPAMIDOL (ISOVUE-300) INJECTION 61%
100.0000 mL | Freq: Once | INTRAVENOUS | Status: AC | PRN
  Administered 2016-09-10: 100 mL via INTRAVENOUS

## 2016-09-10 MED ORDER — SODIUM CHLORIDE 0.9 % IV BOLUS (SEPSIS)
1000.0000 mL | Freq: Once | INTRAVENOUS | Status: AC
  Administered 2016-09-10: 1000 mL via INTRAVENOUS

## 2016-09-10 MED ORDER — PROMETHAZINE HCL 25 MG/ML IJ SOLN
12.5000 mg | Freq: Once | INTRAMUSCULAR | Status: AC
  Administered 2016-09-10: 12.5 mg via INTRAVENOUS
  Filled 2016-09-10: qty 1

## 2016-09-10 MED ORDER — PROMETHAZINE HCL 25 MG PO TABS: 25.0000 mg | ORAL_TABLET | Freq: Four times a day (QID) | ORAL | 0 refills | Status: DC | PRN

## 2016-09-10 MED FILL — PROMETHAZINE 25 MG TABLET: 25 | 5 days supply | Qty: 20 | Fill #0

## 2016-09-10 NOTE — ED Notes (Signed)
ED Provider at bedside. 

## 2016-09-10 NOTE — Discharge Instructions (Signed)
Read the information below.  Use the prescribed medication as directed.  Please discuss all new medications with your pharmacist.  You may return to the Emergency Department at any time for worsening condition or any new symptoms that concern you.

## 2016-09-10 NOTE — ED Notes (Signed)
Patient transported to CT 

## 2016-09-10 NOTE — ED Triage Notes (Addendum)
Body aches and rash x 1 week. She was seen by her MD yesterday after starting on Prednisone a week ago with no improvement in body ahes. She feels worse this am. Pale. States she may have been bitten by a tick but did not remove a tick.

## 2016-09-10 NOTE — ED Provider Notes (Signed)
[place] [facility] Provider Note   CSN: [id number] Arrival date & time: 09/10/16  1049     History   Chief Complaint Chief Complaint  Patient presents with  . Generalized Body Aches    HPI Deborah Salinas is a 36 y.o. female.  HPI   Pt p/w extreme skin sensitivity and myalgia of the torso and proximal extremities.  The symptoms began two days ago on the same day she stopped a prednisone taper for pruritic rash.  Her pain is worst in the LUQ abdomen but involves her left lower ribs and is worse with any movement, palpation, or deep inspiration.  Unrelieved with ibuprofen.  She denies exogenous estrogen, recent immobilization, hx blood clot.  [place]  Denies fevers, URI symptoms, cough, vomiting, diarrhea, urinary or vaginal symptoms.  She was seen by her PCP for rash via e-visit and also seen for this - she had normal labs with exception of leukocytosis (following prednisone usage), ANA still pending.    Past Medical History:  Diagnosis Date  . Acute gastric ulcers 08/11/2012   EGD 08/11/12: multiple gastric ulcers.  . Anaphylactic reaction 08/12/2012   reaction to IV iron despite premed  . Anemia complicating childbirth 9987   transfused after 2009 C section.  . Celiac disease    POSSIBLE - bx inconclusive 08/2012  . Gestational diabetes mellitus in pregnancy 2009  . Iron deficiency anemia    Ferritin <1 (08/2012)  . Pre-eclampsia 2009  . Thrombocytosis ([place])    ?Reactive in setting of peptic ulcers 08/2012.    Patient Active Problem List   Diagnosis Date Noted  . Physical exam 11/14/2015  . History of gestational diabetes   . Celiac disease? 08/30/2012  . Iron deficiency anemia 08/30/2012  . Gastric ulcer 08/30/2012  . Anaphylactic reaction 08/12/2012    Past Surgical History:  Procedure Laterality Date  . APPENDECTOMY  2000  . CESAREAN SECTION  2009  . COLONOSCOPY  10/2012  . ESOPHAGOGASTRODUODENOSCOPY N/A 08/11/2012   Procedure: ESOPHAGOGASTRODUODENOSCOPY (EGD);  Surgeon: Gatha Mayer, MD;  Location: [facility] ENDOSCOPY;  Service: Endoscopy;  Laterality: N/A;  . TUBAL LIGATION  2009   along with C section.   Marland Kitchen uterine ablation  09/2015    OB History    Gravida Para Term Preterm AB Living   3 3           SAB TAB Ectopic Multiple Live Births                   Home Medications    Prior to Admission medications   Medication Sig Start Date End Date Taking? Authorizing Provider  clobetasol ointment (TEMOVATE) 2.15 % Apply 1 application topically 2 (two) times daily. 09/10/16   Brunetta Jeans, PA-C  promethazine (PHENERGAN) 25 MG tablet Take 1 tablet (25 mg total) by mouth every 6 (six) hours as needed for nausea. 09/10/16   Clayton Bibles, PA-C    Family History Family History  Problem Relation Age of Onset  . Hypertension Mother   . Lymphoma Maternal Grandmother        non-hodgkins lymphoma  . Diabetes Maternal Grandfather   . Stroke Maternal Grandfather   . Colon cancer Neg Hx   . Esophageal cancer Neg Hx   . Rectal cancer Neg Hx   . Stomach cancer Neg Hx     Social History Social History  Substance Use Topics  . Smoking status: Never Smoker  .  Smokeless tobacco: Never Used  . Alcohol use 0.0 oz/week     Comment: minimal social ETOH     Allergies   Dextrans; Nulecit [na ferric gluc cplx in sucrose]; Erythromycin; and Sulfa antibiotics   Review of Systems Review of Systems  All other systems reviewed and are negative.    Physical Exam Updated Vital Signs BP 111/61 (BP Location: Right Arm)   Pulse 81   Temp 97.8 F (36.6 C) (Oral)   Resp 16   Ht 5' 5"  (1.651 m)   Wt 73.5 kg (162 lb)   SpO2 100%   BMI 26.96 kg/m   Physical Exam  Constitutional: She appears well-developed and well-nourished. No distress.  HENT:  Head: Normocephalic and atraumatic.  Neck: Neck supple.  Cardiovascular: Normal rate and regular rhythm.   Pulmonary/Chest: Effort normal and breath sounds  normal. No respiratory distress. She has no wheezes. She has no rales. She exhibits tenderness.  Diffuse tenderness throughout torso.   Abdominal: Soft. She exhibits no distension. There is tenderness.  Diffuse tenderness to even very light touch.  Pt unable to tolerate deep palpation.   Neurological: She is alert.  Skin: She is not diaphoretic.  Few macular patches light pink, irregularly shaped on right proximal lower extremity, one over left lateral chest wall.    Nursing note and vitals reviewed.    ED Treatments / Results  Labs (all labs ordered are listed, but only abnormal results are displayed) Labs Reviewed  COMPREHENSIVE METABOLIC PANEL - Abnormal; Notable for the following:       Result Value   CO2 19 (*)    Calcium 8.7 (*)    All other components within normal limits  LIPASE, BLOOD - Abnormal; Notable for the following:    Lipase 112 (*)    All other components within normal limits  CBC WITH DIFFERENTIAL/PLATELET - Abnormal; Notable for the following:    WBC 12.1 (*)    RBC 5.20 (*)    Platelets 434 (*)    Monocytes Absolute 1.1 (*)    All other components within normal limits  URINALYSIS, ROUTINE W REFLEX MICROSCOPIC - Abnormal; Notable for the following:    Ketones, ur 15 (*)    All other components within normal limits  CK - Abnormal; Notable for the following:    Total CK 32 (*)    All other components within normal limits  HCG, SERUM, QUALITATIVE  B. BURGDORFI ANTIBODIES  ROCKY MTN SPOTTED FVR ABS PNL(IGG+IGM)  I-STAT CG4 LACTIC ACID, ED  I-STAT CG4 LACTIC ACID, ED    EKG  EKG Interpretation None       Radiology Dg Chest 2 View  Result Date: 09/10/2016 CLINICAL DATA:  Shortness of breath EXAM: CHEST  2 VIEW COMPARISON:  10/24/2007 FINDINGS: Normal heart size and mediastinal contours. No acute infiltrate or edema. No effusion or pneumothorax. Chronic thoracic dextrocurvature. No acute osseous findings. IMPRESSION: No evidence of active disease.  Electronically Signed   By: Monte Fantasia M.D.   On: 09/10/2016 14:26   Ct Abdomen Pelvis W Contrast  Result Date: 09/10/2016 CLINICAL DATA:  Chest and abdominal pain starting yesterday with vomiting today. History of appendectomy. EXAM: CT ABDOMEN AND PELVIS WITH CONTRAST TECHNIQUE: Multidetector CT imaging of the abdomen and pelvis was performed using the standard protocol following bolus administration of intravenous contrast. CONTRAST:  159m ISOVUE-300 IOPAMIDOL (ISOVUE-300) INJECTION 61% COMPARISON:  09/19/2012 CT FINDINGS: Lower chest: Normal sized cardiac chambers.  Clear lung bases. Hepatobiliary: Physiologic distention of  the gallbladder with tiny 2-3 mm calculus or polyp near the gallbladder neck. Small focus of fatty infiltration in the right hepatic lobe adjacent to gallbladder fossa. No biliary dilatation. Pancreas: No pancreatic inflammation, edema, main pancreatic ductal dilatation or definite mass. Partial volume averaging of a cleft along the dorsal aspect of the pancreatic body is suggested due to curvature of the pancreas. A tiny side-branch cyst is not entirely excluded. No significant change in appearance since 2014 is likely a benign finding nonetheless. Spleen: Normal Adrenals/Urinary Tract: Normal bilateral adrenal glands and kidneys. The urinary bladder is unremarkable. No obstructive uropathy or nephrolithiasis. Stomach/Bowel: The stomach is partially distended with enteric contrast. There is normal small bowel rotation. A mildly distended fluid filled jejunal loops are seen in the left upper and right lower quadrants which may reflect a mild enteritis. Status post appendectomy. Moderate colonic stool burden within large bowel. Vascular/Lymphatic: No significant vascular findings are present. No enlarged abdominal or pelvic lymph nodes. Reproductive: Enhancing irregular cyst in the left ovary measuring 1.5 cm consistent with either hemorrhagic cyst or corpus luteum. An adjacent  simple appearing left ovarian cyst is noted measuring up to 3 cm. Right ovary is unremarkable with small physiologic sized follicles. Other: No abdominal wall hernia or abnormality. No abdominopelvic ascites. Musculoskeletal: No acute or significant osseous findings. IMPRESSION: 1. Slight fluid-filled distention of jejunal loops suspicious for an enteritis. No bowel obstruction. Status post appendectomy. 2. Corpus luteal cyst or hemorrhagic cyst associated with the left ovary measuring 1.5 cm. An adjacent simple appearing 3 cm ovarian cyst is also noted on the left. 3. Tiny 2-3 mm polyp or calculus in the neck of the gallbladder without obstruction. 4. No pancreatic edema or mass. Probable partial volume averaging of a cleft along the dorsal aspect of the pancreatic body versus a tiny side-branch cyst or ductal ectasia unchanged since 2014 and consistent with a benign finding. Electronically Signed   By: Ashley Royalty M.D.   On: 09/10/2016 14:40    Procedures Procedures (including critical care time)  Medications Ordered in ED Medications  sodium chloride 0.9 % bolus 1,000 mL (0 mLs Intravenous Stopped 09/10/16 1247)  HYDROmorphone (DILAUDID) injection 1 mg (1 mg Intravenous Given 09/10/16 1204)  ondansetron (ZOFRAN) injection 4 mg (4 mg Intravenous Given 09/10/16 1216)  ondansetron (ZOFRAN) injection 4 mg (4 mg Intravenous Given 09/10/16 1307)  iopamidol (ISOVUE-300) 61 % injection 100 mL (100 mLs Intravenous Contrast Given 09/10/16 1408)  sodium chloride 0.9 % bolus 1,000 mL (0 mLs Intravenous Stopped 09/10/16 1629)  promethazine (PHENERGAN) injection 12.5 mg (12.5 mg Intravenous Given 09/10/16 1527)     Initial Impression / Assessment and Plan / ED Course  I have reviewed the triage vital signs and the nursing notes.  Pertinent labs & imaging results that were available during my care of the patient were reviewed by me and considered in my medical decision making (see chart for details).  Clinical  Course as of Sep 10 1656  Thu Sep 10, 2016  1217 Discussed pt with Dr Stark Jock who will also see the patient.    [EW]    Clinical Course User Index [EW] [place], Horatio Bertz, [place]    Afebrile, nontoxic patient with pain/myalgia and skin hypersensitivity of the torso and proximal extremities that began two days ago, the day she stopped steroid treatment (short burst).  The rash she initially had was dx as contact dermatitis.  It does not have a clinically concerning appearance.  I doubt tick-borne disease.  She is tender everywhere, mostly over her skin and c/o worst pain in LUQ abdomen.  Workup without findings to explain symptoms.  Pt discussed with Dr Stark Jock who also saw the patient.  Question possible reaction to/side effect from prednisone.  PCP concerned for possible triggering of autoimmune disorder from stopping prednisone.  Unclear etiology of pain.  Pt feeling better from pain standpoint but developed nausea from dilaudid, this was also treated. D/C home with close PCP follow up.  Discussed result, findings, treatment, and follow up  with patient.  Pt given return precautions.  Pt verbalizes understanding and agrees with plan.       Final Clinical Impressions(s) / ED Diagnoses   Final diagnoses:  Pain  Myalgia  Skin sensitivity  Cyst of left ovary  Gallbladder polyp    New Prescriptions Discharge Medication List as of 09/10/2016  4:26 PM    START taking these medications   Details  promethazine (PHENERGAN) 25 MG tablet Take 1 tablet (25 mg total) by mouth every 6 (six) hours as needed for nausea., Starting Thu 09/10/2016, Print         [place], [place], PA-C 09/10/16 1658    Veryl Speak, MD 09/11/16 1015

## 2016-09-10 NOTE — Telephone Encounter (Signed)
[facility] Patient Name: Deborah Salinas Gender: Female DOB: [birth year] Age: 36 Y 10 M 24 D Return Phone Number: [phone] (Primary), [phone] (Secondary) City/State/Zip: [place] Client [place] Client Site [place] Who Is Calling Lab Lab Name [facility] Phone Number [phone] option 2 Lab Tech Name Janett Billow Lab Reference Number [id number] Chief Complaint Lab Result (Critical or Stat) Call Type Lab Send to RN Reason for Call Report lab results Initial Comment Caller, Janett Billow from [facility] reporting STAT lab. Raiford Noble - ordering physician Additional Comment Nurse Assessment Nurse: Christel Mormon, RN, Levada Dy Date/Time Eilene Ghazi Time): 09/09/2016 8:53:42 PM Is there an on-call provider listed? ---Yes Please list name of person reporting value (Lab Employee) and a contact number. ---Janett Billow [phone] #2 - Please document the following items: Lab name Lab value (read back to lab to verify) Reference range for lab value Date and time blood was drawn Collect time of birth for bilirubin results ---Stat CBC w/ Diff, SED Rate, CMP, CK total, ANA (will take 2 days to get back) WBC HI 13.1 (3.8-10.8), PLT HI 414 (140-400), Absolute Monocyte HI 1441 (200-950) everything else was normal and ANA will take a couple of days to get back. Please collect the patient contact information from the lab. (name, phone number and address) ---see above Guidelines Guideline Title [place]. Time Eilene Ghazi Time) Disposition Final User 09/09/2016 9:20:21 PM Clinical Call Yes Christel Mormon, RN, Levada Dy Comments User: Donnie Aho, RN Date/Time Eilene Ghazi Time): 09/09/2016 9:00:12 PM lab drawn - today @ 4:31 pm User: Donnie Aho, RN Date/Time (Eastern Time): 09/09/2016 9:20:10 PM Pt's home number is a non-working number-  called cell number- Her name was on the recording- left message to call office tomorrow re: labs and if she started feeling really bad or spiking a fever, then she should go to the ER. Instructed to call us back if she has questions. PLEASE NOTE: All timestamps contained within this report are represented as [place] Standard Time. CONFIDENTIALTY NOTICE: This fax transmission is intended only for the addressee. It contains information that is legally privileged, confidential or otherwise protected from use or disclosure. If you are not the intended recipient, you are strictly prohibited from reviewing, disclosing, copying using or disseminating any of this information or taking any action in reliance on or regarding this information. If you have received this fax in error, please notify us immediately by telephone so that we can arrange for its return to [place]. Phone: [phone], Toll-Free: [phone], Fax: [phone] Page: 2 of 2 Call Id: [id number] [place] Phone DateTime Action Result/Outcome Notes Kathlene November - MD [phone] 09/09/2016 9:01:49 PM Doctor Paged Paged On Call Back to Call Center please call Levada Dy RN at the call center [phone] Kathlene November - MD 09/09/2016 9:12:53 PM Message Result Spoke with On Call - General page returned - labs given, instructed to call patient and instructed if starts spiking a fever tonight or if she started to feel bad, go to ER otherwise follow up with office tomorrow. Dr. Larose Kells will send email to PCP

## 2016-09-10 NOTE — Telephone Encounter (Signed)
Spoke with patient concerning lab results (printed from [place] as they have not placed in EMR yet). WBC mildly elevated at 13.1 but patient recently finished steroids given during e-visit.  Increase in Monocytes. Normal CMP, CK and sed rate. CBC otherwise unremarkable. ANA pending. Patient states myalgias significantly worsened overnight. Crying in pain. Triaged patient directly to ER for assessment and pain relief. She is being taken there now.

## 2016-09-11 ENCOUNTER — Telehealth (HOSPITAL_COMMUNITY): Payer: Self-pay | Admitting: *Deleted

## 2016-09-11 ENCOUNTER — Other Ambulatory Visit: Payer: Self-pay | Admitting: Physician Assistant

## 2016-09-11 DIAGNOSIS — R21 Rash and other nonspecific skin eruption: Secondary | ICD-10-CM

## 2016-09-11 DIAGNOSIS — M791 Myalgia, unspecified site: Secondary | ICD-10-CM

## 2016-09-11 LAB — B. BURGDORFI ANTIBODIES

## 2016-09-11 MED ORDER — MELOXICAM 15 MG PO TABS: 15.0000 mg | ORAL_TABLET | Freq: Every day | ORAL | 0 refills | Status: DC

## 2016-09-11 MED ORDER — DOXYCYCLINE HYCLATE 100 MG PO TABS: 100.0000 mg | ORAL_TABLET | Freq: Two times a day (BID) | ORAL | 0 refills | Status: AC

## 2016-09-11 NOTE — Telephone Encounter (Signed)
Per Dr Haroldine Laws pt needs Doxy 100 mg BID for 14 days, pt aware, rx sent in

## 2016-09-12 LAB — ROCKY MTN SPOTTED FVR ABS PNL(IGG+IGM)
RMSF IGM: 0.6 {index} (ref 0.00–0.89)
RMSF IgG: NEGATIVE

## 2016-09-15 DIAGNOSIS — L239 Allergic contact dermatitis, unspecified cause: Secondary | ICD-10-CM | POA: Diagnosis not present

## 2016-09-16 ENCOUNTER — Ambulatory Visit: Payer: 59 | Admitting: Family Medicine

## 2018-05-05 ENCOUNTER — Other Ambulatory Visit: Payer: Self-pay

## 2018-05-05 ENCOUNTER — Ambulatory Visit (INDEPENDENT_AMBULATORY_CARE_PROVIDER_SITE_OTHER): Payer: 59 | Admitting: Family Medicine

## 2018-05-05 ENCOUNTER — Encounter: Payer: Self-pay | Admitting: Family Medicine

## 2018-05-05 VITALS — BP 100/70 | HR 95 | Temp 98.1°F | Resp 16 | Ht 65.0 in | Wt 165.4 lb

## 2018-05-05 DIAGNOSIS — M7062 Trochanteric bursitis, left hip: Secondary | ICD-10-CM

## 2018-05-05 DIAGNOSIS — M76892 Other specified enthesopathies of left lower limb, excluding foot: Secondary | ICD-10-CM

## 2018-05-05 MED ORDER — NAPROXEN 500 MG PO TABS: 500.0000 mg | ORAL_TABLET | Freq: Two times a day (BID) | ORAL | 0 refills | Status: DC

## 2018-05-05 MED ORDER — METHOCARBAMOL 750 MG PO TABS: 750.0000 mg | ORAL_TABLET | Freq: Three times a day (TID) | ORAL | 0 refills | Status: DC | PRN

## 2018-05-05 NOTE — Progress Notes (Signed)
   Subjective:    Patient ID: Deborah Salinas, female    DOB: 10-10-1980, 38 y.o.   MRN: [id number]  HPI Leg pain- working out w/ trainer since September.  Was doing mountain climbers on Monday and when she stood up she had pain and tightness across L anterolateral thigh/crease.  The tightness is causing L foot to turn out when walking which is causing numbness of 3rd-5th toes.   Review of Systems For ROS see HPI     Objective:   Physical Exam Vitals signs reviewed.  Constitutional:      Appearance: Normal appearance.  Musculoskeletal:        General: Tenderness (TTP over L trochanteric bursa, TTP over hip flexor insertion) present.  Neurological:     General: No focal deficit present.     Mental Status: She is alert.     Coordination: Coordination normal.     Gait: Gait abnormal (antalgic gait).     Deep Tendon Reflexes: Reflexes normal.     Comments: Numbness of L foot in neutral position and when turned in.  Numbness resolves w/ foot turned out           Assessment & Plan:  Trochanteric bursitis- new.  Pt's TTP over L greater trochanter is consistent w/ inflammation.  Start scheduled NSAIDs (pt has been intolerant to Prednisone in the past)  Hip flexor tendinitis- new.  Likely due to overuse during exercise.  Reviewed dx w/ pt.  Encouraged rest, ice, and scheduled NSAIDs.  Pt expressed understanding and is in agreement w/ plan.

## 2018-05-05 NOTE — Patient Instructions (Signed)
Follow up as needed or as scheduled START the Naproxen twice daily- take w/ food USE the Methocarbamol as needed for spasm- may cause drowsiness so best before bed ICE! Listen to your body and take it easy when needed Call with any questions or concerns Hang in there!!!

## 2018-07-15 IMAGING — CR DG CHEST 2V
2 series · 2 of 2 positions shown · non-contrast
Comparison: 10/24/2007

CLINICAL DATA: Shortness of breath

EXAM:
CHEST  2 VIEW

[w chest pa]
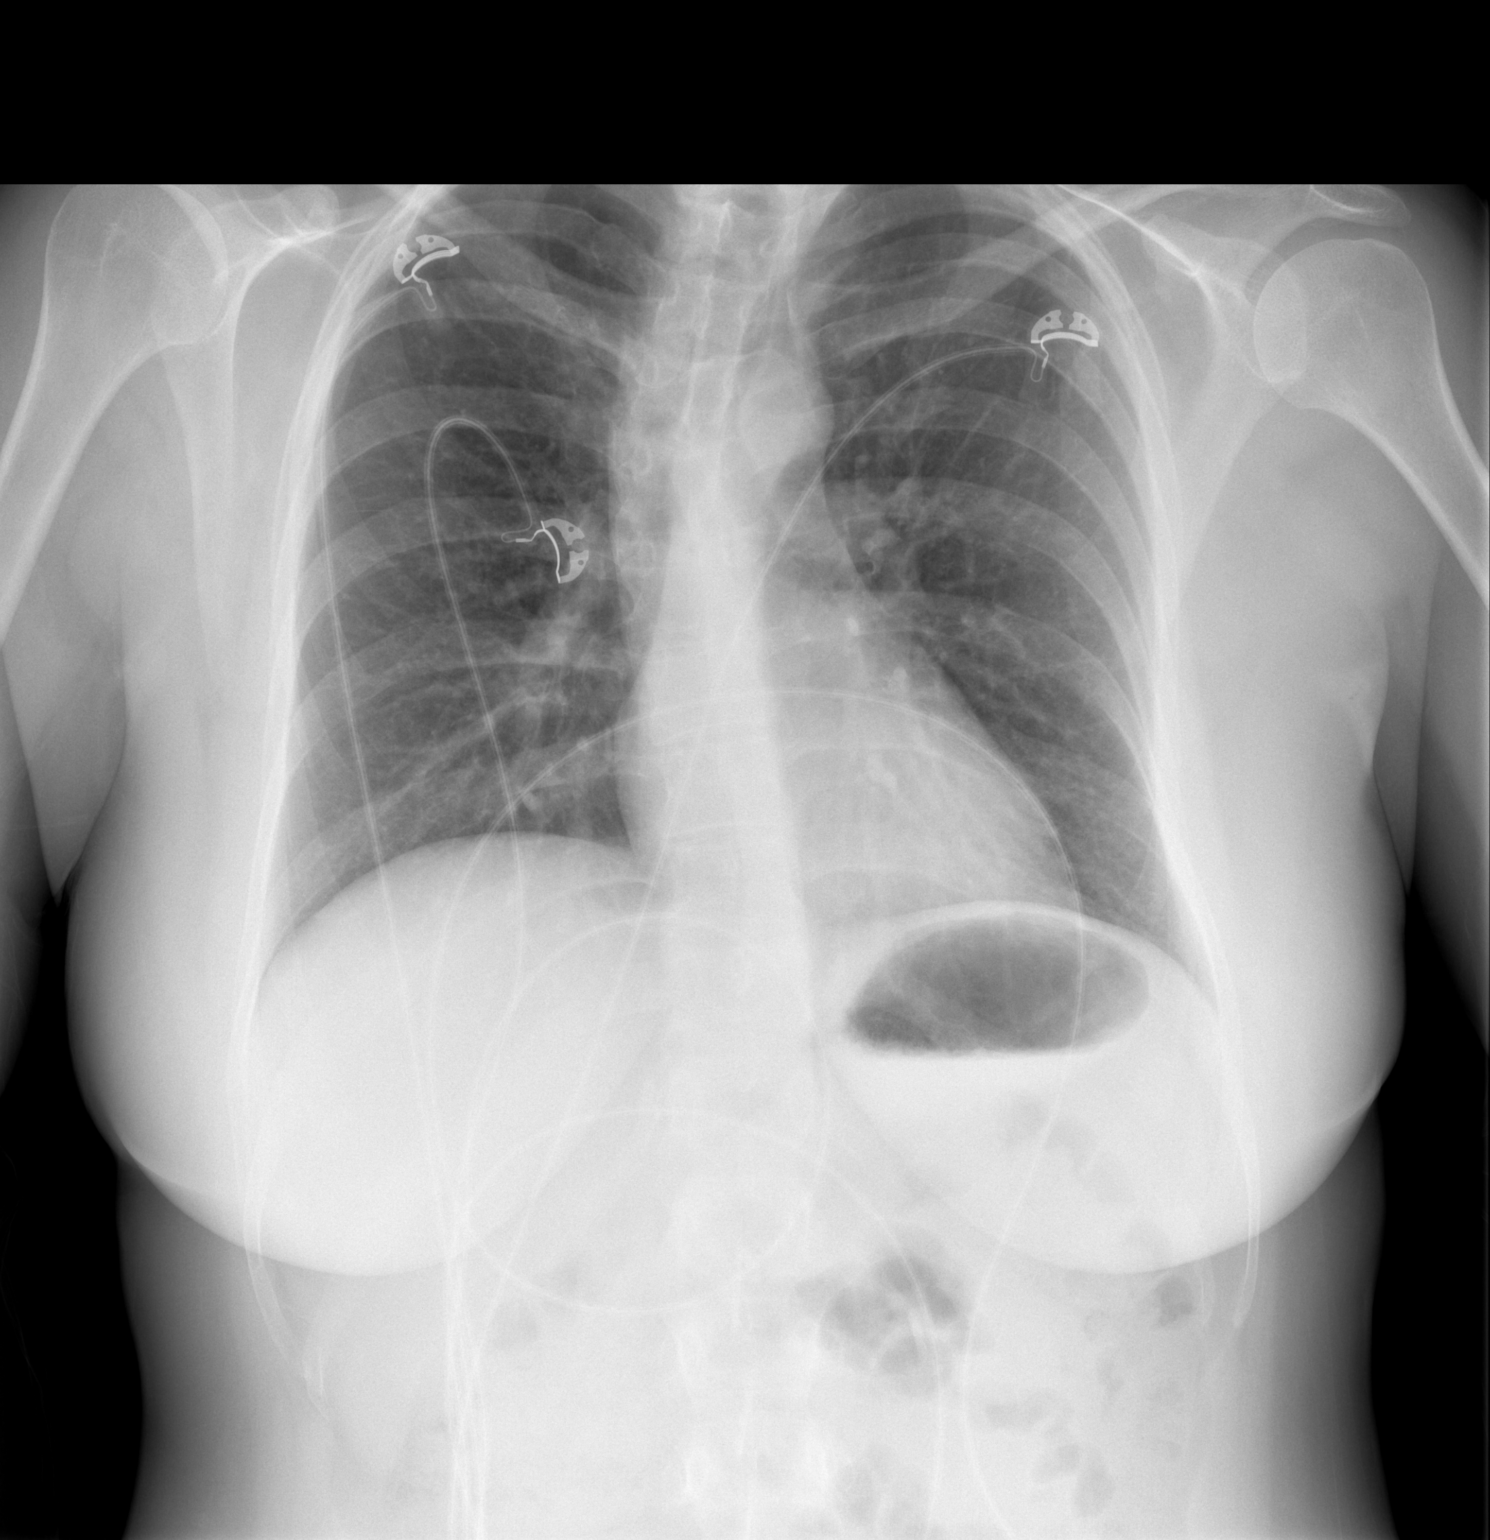

[w chest lat]
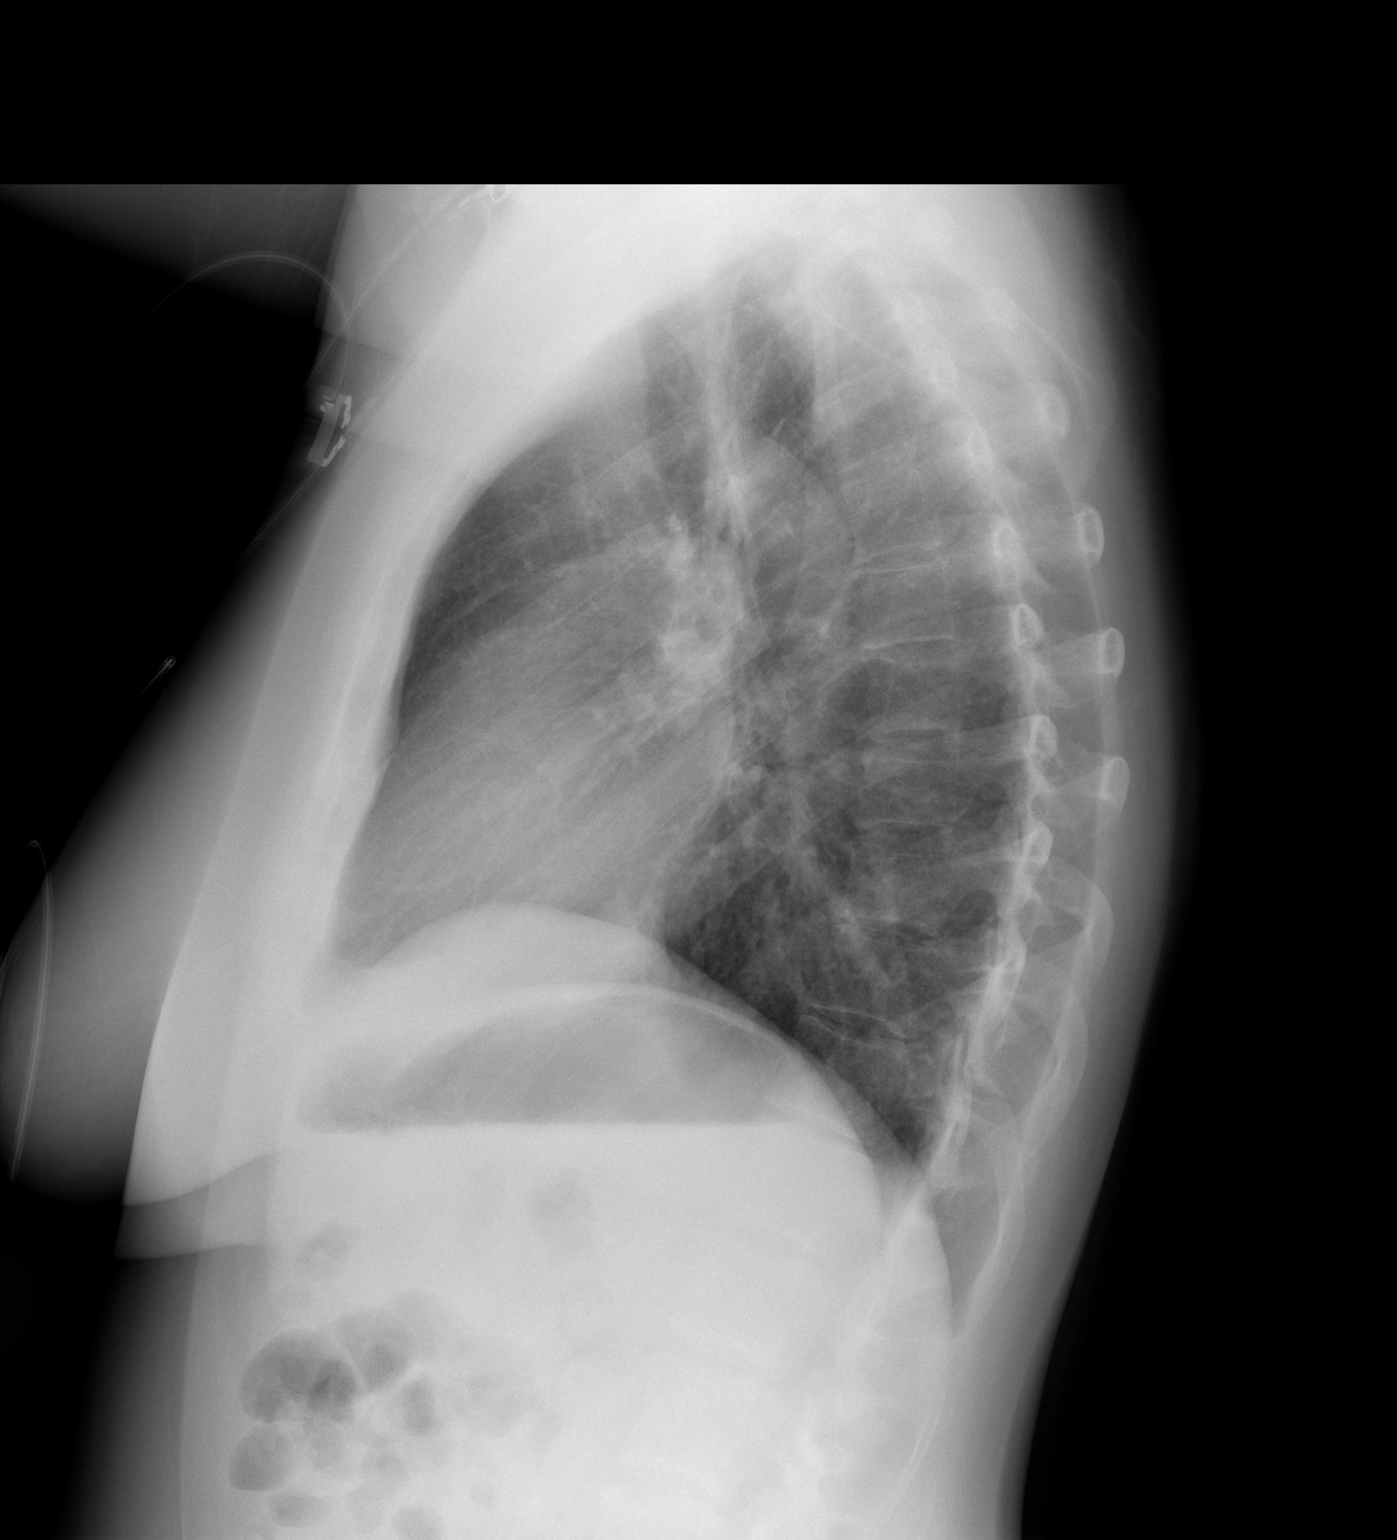

[2 of 2 positions shown; findings below may reference images not displayed]

FINDINGS: Normal heart size and mediastinal contours. No acute infiltrate or
edema. No effusion or pneumothorax. Chronic thoracic
dextrocurvature. No acute osseous findings.
IMPRESSION: No evidence of active disease.

## 2018-08-12 DIAGNOSIS — N6459 Other signs and symptoms in breast: Secondary | ICD-10-CM | POA: Diagnosis not present

## 2018-09-15 DIAGNOSIS — Z131 Encounter for screening for diabetes mellitus: Secondary | ICD-10-CM | POA: Diagnosis not present

## 2018-09-15 DIAGNOSIS — Z1151 Encounter for screening for human papillomavirus (HPV): Secondary | ICD-10-CM | POA: Diagnosis not present

## 2018-09-15 DIAGNOSIS — Z6826 Body mass index (BMI) 26.0-26.9, adult: Secondary | ICD-10-CM | POA: Diagnosis not present

## 2018-09-15 DIAGNOSIS — Z0000 Encounter for general adult medical examination without abnormal findings: Secondary | ICD-10-CM | POA: Diagnosis not present

## 2018-09-15 DIAGNOSIS — Z01419 Encounter for gynecological examination (general) (routine) without abnormal findings: Secondary | ICD-10-CM | POA: Diagnosis not present

## 2018-09-15 DIAGNOSIS — Z13220 Encounter for screening for lipoid disorders: Secondary | ICD-10-CM | POA: Diagnosis not present

## 2018-09-15 DIAGNOSIS — Z130 Encounter for screening for diseases of the blood and blood-forming organs and certain disorders involving the immune mechanism: Secondary | ICD-10-CM | POA: Diagnosis not present

## 2018-09-15 DIAGNOSIS — Z1329 Encounter for screening for other suspected endocrine disorder: Secondary | ICD-10-CM | POA: Diagnosis not present

## 2018-09-26 ENCOUNTER — Encounter: Payer: Self-pay | Admitting: Family Medicine

## 2019-02-08 ENCOUNTER — Other Ambulatory Visit: Payer: Self-pay

## 2019-02-08 ENCOUNTER — Encounter: Payer: Self-pay | Admitting: Family Medicine

## 2019-02-08 ENCOUNTER — Ambulatory Visit: Payer: 59 | Admitting: Family Medicine

## 2019-02-08 VITALS — BP 130/80 | HR 70 | Temp 97.9°F | Resp 16 | Ht 65.0 in | Wt 157.0 lb

## 2019-02-08 DIAGNOSIS — H6191 Disorder of right external ear, unspecified: Secondary | ICD-10-CM | POA: Diagnosis not present

## 2019-02-08 NOTE — Patient Instructions (Signed)
Follow up as needed or as scheduled We'll call you with your Derm appt Call with any questions or concerns Stay Safe!!

## 2019-02-08 NOTE — Progress Notes (Signed)
   Subjective:    Patient ID: Deborah Salinas, female    DOB: [birth year], 38 y.o.   MRN: [id number]  HPI Skin lesion- 'a rough spot on the top' of R ear.  Noticed 2.5 weeks ago.  No bleeding.  + itching.  No pain.  No similar areas on other ear.  + sun damage   Review of Systems For ROS see HPI     Objective:   Physical Exam Vitals signs reviewed.  Constitutional:      Appearance: Normal appearance.  HENT:     Head: Normocephalic and atraumatic.     Ears:     Comments: Rough, scaly patch on R superior pinna Skin:    General: Skin is warm and dry.     Findings: No rash.  Neurological:     General: No focal deficit present.     Mental Status: She is alert and oriented to person, place, and time.  Psychiatric:        Mood and Affect: Mood normal.        Behavior: Behavior normal.        Thought Content: Thought content normal.           Assessment & Plan:  Skin lesion of R ear- new.  Appears to be sun damaged, possibly precancerous.  Will refer to derm for treatment.

## 2019-03-06 DIAGNOSIS — D1801 Hemangioma of skin and subcutaneous tissue: Secondary | ICD-10-CM | POA: Diagnosis not present

## 2019-03-06 DIAGNOSIS — D2261 Melanocytic nevi of right upper limb, including shoulder: Secondary | ICD-10-CM | POA: Diagnosis not present

## 2019-03-06 DIAGNOSIS — D225 Melanocytic nevi of trunk: Secondary | ICD-10-CM | POA: Diagnosis not present

## 2019-03-06 DIAGNOSIS — H61001 Unspecified perichondritis of right external ear: Secondary | ICD-10-CM | POA: Diagnosis not present

## 2019-03-06 DIAGNOSIS — D485 Neoplasm of uncertain behavior of skin: Secondary | ICD-10-CM | POA: Diagnosis not present

## 2019-03-06 DIAGNOSIS — D2262 Melanocytic nevi of left upper limb, including shoulder: Secondary | ICD-10-CM | POA: Diagnosis not present

## 2019-08-01 ENCOUNTER — Ambulatory Visit: Payer: 59 | Admitting: Family Medicine

## 2019-08-01 ENCOUNTER — Encounter: Payer: Self-pay | Admitting: Family Medicine

## 2019-08-01 ENCOUNTER — Other Ambulatory Visit: Payer: Self-pay

## 2019-08-01 VITALS — BP 124/82 | HR 72 | Temp 98.0°F | Resp 16 | Ht 65.0 in | Wt 158.5 lb

## 2019-08-01 DIAGNOSIS — R5383 Other fatigue: Secondary | ICD-10-CM

## 2019-08-01 DIAGNOSIS — K900 Celiac disease: Secondary | ICD-10-CM

## 2019-08-01 DIAGNOSIS — R002 Palpitations: Secondary | ICD-10-CM | POA: Diagnosis not present

## 2019-08-01 DIAGNOSIS — K219 Gastro-esophageal reflux disease without esophagitis: Secondary | ICD-10-CM | POA: Diagnosis not present

## 2019-08-01 LAB — CBC WITH DIFFERENTIAL/PLATELET
Basophils Absolute: 0.1 10*3/uL (ref 0.0–0.1)
Basophils Relative: 1.4 % (ref 0.0–3.0)
Eosinophils Absolute: 0.1 10*3/uL (ref 0.0–0.7)
Eosinophils Relative: 1.2 % (ref 0.0–5.0)
HCT: 43 % (ref 36.0–46.0)
Hemoglobin: 14.7 g/dL (ref 12.0–15.0)
Lymphocytes Relative: 29.2 % (ref 12.0–46.0)
Lymphs Abs: 1.8 10*3/uL (ref 0.7–4.0)
MCHC: 34.1 g/dL (ref 30.0–36.0)
MCV: 92 fl (ref 78.0–100.0)
Monocytes Absolute: 0.5 10*3/uL (ref 0.1–1.0)
Monocytes Relative: 8.7 % (ref 3.0–12.0)
Neutro Abs: 3.6 10*3/uL (ref 1.4–7.7)
Neutrophils Relative %: 59.5 % (ref 43.0–77.0)
Platelets: 314 10*3/uL (ref 150.0–400.0)
RBC: 4.68 Mil/uL (ref 3.87–5.11)
RDW: 12.2 % (ref 11.5–15.5)
WBC: 6.1 10*3/uL (ref 4.0–10.5)

## 2019-08-01 LAB — HEPATIC FUNCTION PANEL
ALT: 25 U/L (ref 0–35)
AST: 20 U/L (ref 0–37)
Albumin: 4 g/dL (ref 3.5–5.2)
Alkaline Phosphatase: 90 U/L (ref 39–117)
Bilirubin, Direct: 0.1 mg/dL (ref 0.0–0.3)
Total Bilirubin: 0.5 mg/dL (ref 0.2–1.2)
Total Protein: 6.6 g/dL (ref 6.0–8.3)

## 2019-08-01 LAB — BASIC METABOLIC PANEL
BUN: 8 mg/dL (ref 6–23)
CO2: 25 mEq/L (ref 19–32)
Calcium: 9 mg/dL (ref 8.4–10.5)
Chloride: 105 mEq/L (ref 96–112)
Creatinine, Ser: 0.75 mg/dL (ref 0.40–1.20)
GFR: 85.25 mL/min (ref >60.00)
Glucose, Bld: 97 mg/dL (ref 70–99)
Potassium: 4.1 mEq/L (ref 3.5–5.1)
Sodium: 137 mEq/L (ref 135–145)

## 2019-08-01 LAB — VITAMIN D 25 HYDROXY (VIT D DEFICIENCY, FRACTURES): VITD: 16.86 ng/mL — ABNORMAL LOW (ref 30.00–100.00)

## 2019-08-01 LAB — TSH: TSH: 1.43 u[IU]/mL (ref 0.35–4.50)

## 2019-08-01 LAB — B12 AND FOLATE PANEL
Folate: 11.2 ng/mL (ref >5.9)
Vitamin B-12: 199 pg/mL — ABNORMAL LOW (ref 211–911)

## 2019-08-01 MED ORDER — PANTOPRAZOLE SODIUM 40 MG PO TBEC: 40.0000 mg | DELAYED_RELEASE_TABLET | Freq: Every day | ORAL | 3 refills | Status: AC

## 2019-08-01 MED FILL — PANTOPRAZOLE SOD DR 40 MG T: 40 | 30 days supply | Qty: 30 | Fill #0

## 2019-08-01 NOTE — Assessment & Plan Note (Signed)
Pt got conflicting dx in 5168.  Would like repeat testing due to recurrence of GI issues.  Will follow.

## 2019-08-01 NOTE — Patient Instructions (Signed)
Schedule your complete physical in 3-4 months We'll notify you of your lab results and make any changes if needed START the Protonix daily Monitor the PVCs/palpitations and let me know Call with any questions or concerns Stay Safe!  Stay Healthy! Happy Spring!!!

## 2019-08-01 NOTE — Progress Notes (Signed)
   Subjective:    Patient ID: Deborah Salinas, female    DOB: 1981-02-12, 39 y.o.   MRN: [id number]  HPI GERD- 'I think the ulcer's back.  I feel like I did 7 yrs ago'.  Has not been on any reflux medication.  Occurring after eating but 'not all the time'.  No specific triggers  ? Celiac disease- pt received conflicting dx previously.  Has not been following w gluten free diet.  Now w/ fatigue and GI sxs would like to be retested  Fatigue- restarted iron a few days ago.  sxs started ~2 weeks ago and are worsening.  Sleeping 'ok' but does have quite a bit on her plate.  Palpitations- increased PVCs recently, particularly w/ exercise.  'but they're ok'.   Review of Systems For ROS see HPI   This visit occurred during the SARS-CoV-2 public health emergency.  Safety protocols were in place, including screening questions prior to the visit, additional usage of staff PPE, and extensive cleaning of exam room while observing appropriate contact time as indicated for disinfecting solutions.       Objective:   Physical Exam Vitals reviewed.  Constitutional:      General: She is not in acute distress.    Appearance: Normal appearance. She is well-developed.  HENT:     Head: Normocephalic and atraumatic.  Eyes:     Conjunctiva/sclera: Conjunctivae normal.     Pupils: Pupils are equal, round, and reactive to light.  Neck:     Thyroid: No thyromegaly.  Cardiovascular:     Rate and Rhythm: Normal rate and regular rhythm.     Heart sounds: Normal heart sounds. No murmur.  Pulmonary:     Effort: Pulmonary effort is normal. No respiratory distress.     Breath sounds: Normal breath sounds.  Abdominal:     General: There is no distension.     Palpations: Abdomen is soft.     Tenderness: There is no abdominal tenderness.  Musculoskeletal:     Cervical back: Normal range of motion and neck supple.  Lymphadenopathy:     Cervical: No cervical adenopathy.  Skin:    General: Skin is warm and dry.    Neurological:     Mental Status: She is alert and oriented to person, place, and time.  Psychiatric:        Behavior: Behavior normal.           Assessment & Plan:  Fatigue- new.  Pt reports sleeping ok but admits she does have a lot going on right now.  Will check labs to assess for metabolic cause and treat if needed.  Pt expressed understanding and is in agreement w/ plan.   Palpitations- pt reports occasional symptomatic PVCs.  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

## 2019-08-01 NOTE — Assessment & Plan Note (Signed)
Deteriorated.  Pt has hx of gastric ulcer.  Check H pylori.  Start PPI.  Reviewed supportive care and red flags that should prompt return.  Pt expressed understanding and is in agreement w/ plan.

## 2019-08-02 ENCOUNTER — Other Ambulatory Visit: Payer: Self-pay | Admitting: General Practice

## 2019-08-02 MED ORDER — VITAMIN D (ERGOCALCIFEROL) 1.25 MG (50000 UNIT) PO CAPS: 50000.0000 [IU] | ORAL_CAPSULE | ORAL | 0 refills | Status: AC

## 2019-08-02 MED FILL — VIT D2 1.25 MG (50,000 UNIT: 1.25 MG | 84 days supply | Qty: 12 | Fill #0

## 2019-08-03 ENCOUNTER — Encounter: Payer: Self-pay | Admitting: Family Medicine

## 2019-08-03 DIAGNOSIS — R002 Palpitations: Secondary | ICD-10-CM

## 2019-08-03 DIAGNOSIS — R0602 Shortness of breath: Secondary | ICD-10-CM

## 2019-08-03 DIAGNOSIS — R5383 Other fatigue: Secondary | ICD-10-CM

## 2019-08-03 LAB — H PYLORI, IGM, IGG, IGA AB
H pylori, IgM Abs: <9 units (ref 0.0–8.9)
H. pylori, IgA Abs: <9 units (ref 0.0–8.9)
H. pylori, IgG AbS: 0.16 Index Value (ref 0.00–0.79)

## 2019-08-04 ENCOUNTER — Other Ambulatory Visit (HOSPITAL_COMMUNITY): Payer: Self-pay | Admitting: Cardiology

## 2019-08-04 ENCOUNTER — Other Ambulatory Visit: Payer: Self-pay

## 2019-08-04 ENCOUNTER — Ambulatory Visit (HOSPITAL_COMMUNITY)
Admission: RE | Admit: 2019-08-04 | Discharge: 2019-08-04 | Disposition: A | Discharge status: Home / Self Care | Payer: 59 | Source: Ambulatory Visit | Attending: Family Medicine | Admitting: Family Medicine

## 2019-08-04 DIAGNOSIS — R079 Chest pain, unspecified: Secondary | ICD-10-CM | POA: Diagnosis not present

## 2019-08-04 DIAGNOSIS — R5383 Other fatigue: Secondary | ICD-10-CM

## 2019-08-04 DIAGNOSIS — R0602 Shortness of breath: Secondary | ICD-10-CM

## 2019-08-04 DIAGNOSIS — R002 Palpitations: Secondary | ICD-10-CM

## 2019-08-04 MED ORDER — IOHEXOL 350 MG/ML SOLN
80.0000 mL | Freq: Once | INTRAVENOUS | Status: AC | PRN
  Administered 2019-08-04: 80 mL via INTRAVENOUS

## 2019-08-04 MED ORDER — NITROGLYCERIN 0.4 MG SL SUBL
SUBLINGUAL_TABLET | SUBLINGUAL | Status: AC
  Filled 2019-08-04: qty 1

## 2019-08-12 LAB — GLIADIN ANTIBODIES, SERUM
Gliadin IgA: >100 Units — ABNORMAL HIGH
Gliadin IgG: 73 Units — ABNORMAL HIGH

## 2019-08-12 LAB — RETICULIN ANTIBODIES, IGA W TITER: Reticulin IGA Screen: NEGATIVE

## 2019-08-12 LAB — TISSUE TRANSGLUTAMINASE, IGA: (tTG) Ab, IgA: 2 U/mL

## 2019-08-17 ENCOUNTER — Encounter: Payer: Self-pay | Admitting: General Practice

## 2019-11-01 ENCOUNTER — Encounter: Payer: 59 | Admitting: Family Medicine

## 2019-11-01 DIAGNOSIS — Z0289 Encounter for other administrative examinations: Secondary | ICD-10-CM

## 2019-12-21 ENCOUNTER — Other Ambulatory Visit: Payer: Self-pay

## 2020-08-21 ENCOUNTER — Telehealth: Payer: 59 | Admitting: Physician Assistant

## 2020-08-21 DIAGNOSIS — J069 Acute upper respiratory infection, unspecified: Secondary | ICD-10-CM

## 2020-08-22 MED ORDER — BENZONATATE 100 MG PO CAPS: 100.0000 mg | ORAL_CAPSULE | Freq: Three times a day (TID) | ORAL | 0 refills | Status: AC | PRN

## 2020-08-22 MED ORDER — FLUTICASONE PROPIONATE 50 MCG/ACT NA SUSP: 2.0000 | Freq: Every day | NASAL | 0 refills | Status: AC

## 2020-08-22 NOTE — Progress Notes (Signed)
We are sorry you are not feeling well.  Here is how we plan to help!  Based on what you have shared with me, it looks like you may have a viral upper respiratory infection.  Upper respiratory infections are caused by a large number of viruses; however, rhinovirus is the most common cause. Giving negative COVID test but fever, etc, I recommend getting flu tested and a repeat COVID test (can be done at [facility] if you are wanting to avoid doctor's office) to make sure that is not the cause of your symptoms, just to be cautious.  Symptoms vary from person to person, with common symptoms including sore throat, cough, fatigue or lack of energy and feeling of general discomfort.  A low-grade fever of up to 100.4 may present, but is often uncommon.  Symptoms vary however, and are closely related to a person's age or underlying illnesses.  The most common symptoms associated with an upper respiratory infection are nasal discharge or congestion, cough, sneezing, headache and pressure in the ears and face.  These symptoms usually persist for about 3 to 10 days, but can last up to 2 weeks.  It is important to know that upper respiratory infections do not cause serious illness or complications in most cases.    Upper respiratory infections can be transmitted from person to person, with the most common method of transmission being a person's hands.  The virus is able to live on the skin and can infect other persons for up to 2 hours after direct contact.  Also, these can be transmitted when someone coughs or sneezes; thus, it is important to cover the mouth to reduce this risk.  To keep the spread of the illness at [place], good hand hygiene is very important.  This is an infection that is most likely caused by a virus. There are no specific treatments other than to help you with the symptoms until the infection runs its course.  We are sorry you are not feeling well.  Here is how we plan to help!   For nasal  congestion, you may use an oral decongestants such as Mucinex D or if you have glaucoma or high blood pressure use plain Mucinex.  Saline nasal spray or nasal drops can help and can safely be used as often as needed for congestion.  For your congestion, I have prescribed Fluticasone nasal spray one spray in each nostril twice a day  If you do not have a history of heart disease, hypertension, diabetes or thyroid disease, prostate/bladder issues or glaucoma, you may also use Sudafed to treat nasal congestion.  It is highly recommended that you consult with a pharmacist or your primary care physician to ensure this medication is safe for you to take.     If you have a cough, you may use cough suppressants such as Delsym and Robitussin.  If you have glaucoma or high blood pressure, you can also use Coricidin HBP.   For cough I have prescribed for you A prescription cough medication called Tessalon Perles 100 mg. You may take 1-2 capsules every 8 hours as needed for cough  If you have a sore or scratchy throat, use a saltwater gargle-  to  teaspoon of salt dissolved in a 4-ounce to 8-ounce glass of warm water.  Gargle the solution for approximately 15-30 seconds and then spit.  It is important not to swallow the solution.  You can also use throat lozenges/cough drops and Chloraseptic spray to  help with throat pain or discomfort.  Warm or cold liquids can also be helpful in relieving throat pain.  For headache, pain or general discomfort, you can use Ibuprofen or Tylenol as directed.   Some authorities believe that zinc sprays or the use of Echinacea may shorten the course of your symptoms.   HOME CARE . Only take medications as instructed by your medical team. . Be sure to drink plenty of fluids. Water is fine as well as fruit juices, sodas and electrolyte beverages. You may want to stay away from caffeine or alcohol. If you are nauseated, try taking small sips of liquids. How do you know if you are  getting enough fluid? Your urine should be a pale yellow or almost colorless. . Get rest. . Taking a steamy shower or using a humidifier may help nasal congestion and ease sore throat pain. You can place a towel over your head and breathe in the steam from hot water coming from a faucet. . Using a saline nasal spray works much the same way. . Cough drops, hard candies and sore throat lozenges may ease your cough. . Avoid close contacts especially the very young and the elderly . Cover your mouth if you cough or sneeze . Always remember to wash your hands.   GET HELP RIGHT AWAY IF: . You develop worsening fever. . If your symptoms do not improve within 10 days . You develop yellow or green discharge from your nose over 3 days. . You have coughing fits . You develop a severe head ache or visual changes. . You develop shortness of breath, difficulty breathing or start having chest pain . Your symptoms persist after you have completed your treatment plan  MAKE SURE YOU   Understand these instructions.  Will watch your condition.  Will get help right away if you are not doing well or get worse.  Your e-visit answers were reviewed by a board certified advanced clinical practitioner to complete your personal care plan. Depending upon the condition, your plan could have included both over the counter or prescription medications. Please review your pharmacy choice. If there is a problem, you may call our nursing hot line at and have the prescription routed to another pharmacy. Your safety is important to [place]. If you have drug allergies check your prescription carefully.   You can use MyChart to ask questions about today's visit, request a non-urgent call back, or ask for a work or school excuse for 24 hours related to this e-Visit. If it has been greater than 24 hours you will need to follow up with your provider, or enter a new e-Visit to address those concerns. You will get an e-mail in the  next two days asking about your experience.  I hope that your e-visit has been valuable and will speed your recovery. Thank you for using e-visits.

## 2020-08-22 NOTE — Progress Notes (Signed)
I have spent 5 minutes in review of e-visit questionnaire, review and updating patient chart, medical decision making and response to patient.   Mathayus Stanbery Cody Rilla Buckman, PA-C    

## 2020-10-09 ENCOUNTER — Encounter: Payer: Self-pay | Admitting: *Deleted

## 2022-02-24 ENCOUNTER — Other Ambulatory Visit (HOSPITAL_COMMUNITY): Payer: Self-pay | Admitting: Adult Health Nurse Practitioner

## 2022-02-24 DIAGNOSIS — E049 Nontoxic goiter, unspecified: Secondary | ICD-10-CM

## 2022-03-09 ENCOUNTER — Ambulatory Visit (HOSPITAL_COMMUNITY)
Admission: RE | Admit: 2022-03-09 | Discharge: 2022-03-09 | Disposition: A | Discharge status: Home / Self Care | Payer: 59 | Source: Ambulatory Visit | Attending: Adult Health Nurse Practitioner | Admitting: Adult Health Nurse Practitioner

## 2022-03-09 DIAGNOSIS — E049 Nontoxic goiter, unspecified: Secondary | ICD-10-CM | POA: Insufficient documentation

## 2023-12-07 LAB — COLOGUARD: COLOGUARD: NEGATIVE
